# Patient Record
Sex: Male | Born: 1990
Health system: Southern US, Community
[De-identification: ages and names within clinical notes are randomized; demographics above are authoritative.]

## PROBLEM LIST (undated history)

## (undated) DIAGNOSIS — E079 Disorder of thyroid, unspecified: Secondary | ICD-10-CM

## (undated) DIAGNOSIS — T7840XA Allergy, unspecified, initial encounter: Secondary | ICD-10-CM

## (undated) DIAGNOSIS — Z8619 Personal history of other infectious and parasitic diseases: Secondary | ICD-10-CM

## (undated) DIAGNOSIS — R7989 Other specified abnormal findings of blood chemistry: Secondary | ICD-10-CM

## (undated) DIAGNOSIS — R011 Cardiac murmur, unspecified: Secondary | ICD-10-CM

## (undated) HISTORY — DX: Personal history of other infectious and parasitic diseases: Z86.19

## (undated) HISTORY — DX: Cardiac murmur, unspecified: R01.1

## (undated) HISTORY — DX: Disorder of thyroid, unspecified: E07.9

## (undated) HISTORY — DX: Allergy, unspecified, initial encounter: T78.40XA

---
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
## 2010-06-10 ENCOUNTER — Other Ambulatory Visit: Payer: Self-pay | Admitting: Gastroenterology

## 2010-06-16 ENCOUNTER — Ambulatory Visit
Admission: RE | Admit: 2010-06-16 | Discharge: 2010-06-16 | Disposition: A | Discharge status: Home / Self Care | Payer: Federal, State, Local not specified - PPO | Source: Ambulatory Visit | Attending: Gastroenterology | Admitting: Gastroenterology

## 2011-01-25 ENCOUNTER — Ambulatory Visit (INDEPENDENT_AMBULATORY_CARE_PROVIDER_SITE_OTHER): Payer: Federal, State, Local not specified - PPO

## 2011-01-25 DIAGNOSIS — Z23 Encounter for immunization: Secondary | ICD-10-CM

## 2015-04-18 HISTORY — PX: TISSUE GRAFT: SHX251

## 2015-08-12 DIAGNOSIS — T23292S Burn of second degree of multiple sites of left wrist and hand, sequela: Secondary | ICD-10-CM | POA: Diagnosis not present

## 2015-08-12 DIAGNOSIS — Z945 Skin transplant status: Secondary | ICD-10-CM | POA: Diagnosis not present

## 2015-08-12 DIAGNOSIS — L905 Scar conditions and fibrosis of skin: Secondary | ICD-10-CM | POA: Diagnosis not present

## 2015-08-12 DIAGNOSIS — T310 Burns involving less than 10% of body surface: Secondary | ICD-10-CM | POA: Diagnosis not present

## 2015-08-12 DIAGNOSIS — I998 Other disorder of circulatory system: Secondary | ICD-10-CM | POA: Diagnosis not present

## 2015-08-19 DIAGNOSIS — Z6825 Body mass index (BMI) 25.0-25.9, adult: Secondary | ICD-10-CM | POA: Diagnosis not present

## 2015-08-19 DIAGNOSIS — T23291A Burn of second degree of multiple sites of right wrist and hand, initial encounter: Secondary | ICD-10-CM | POA: Diagnosis not present

## 2015-10-20 DIAGNOSIS — T23291D Burn of second degree of multiple sites of right wrist and hand, subsequent encounter: Secondary | ICD-10-CM | POA: Diagnosis not present

## 2015-10-20 DIAGNOSIS — L910 Hypertrophic scar: Secondary | ICD-10-CM | POA: Diagnosis not present

## 2015-10-20 DIAGNOSIS — R202 Paresthesia of skin: Secondary | ICD-10-CM | POA: Diagnosis not present

## 2015-10-20 DIAGNOSIS — T23392A Burn of third degree of multiple sites of left wrist and hand, initial encounter: Secondary | ICD-10-CM | POA: Diagnosis not present

## 2015-10-20 DIAGNOSIS — T23292A Burn of second degree of multiple sites of left wrist and hand, initial encounter: Secondary | ICD-10-CM | POA: Diagnosis not present

## 2015-10-20 DIAGNOSIS — Z6825 Body mass index (BMI) 25.0-25.9, adult: Secondary | ICD-10-CM | POA: Diagnosis not present

## 2015-10-20 DIAGNOSIS — T310 Burns involving less than 10% of body surface: Secondary | ICD-10-CM | POA: Diagnosis not present

## 2015-11-15 ENCOUNTER — Ambulatory Visit (INDEPENDENT_AMBULATORY_CARE_PROVIDER_SITE_OTHER): Payer: Federal, State, Local not specified - PPO | Admitting: Internal Medicine

## 2015-11-15 ENCOUNTER — Encounter: Payer: Self-pay | Admitting: Internal Medicine

## 2015-11-15 VITALS — BP 110/78 | HR 72 | Temp 98.2°F | Ht 66.5 in | Wt 155.8 lb

## 2015-11-15 DIAGNOSIS — E031 Congenital hypothyroidism without goiter: Secondary | ICD-10-CM | POA: Diagnosis not present

## 2015-11-15 DIAGNOSIS — R6889 Other general symptoms and signs: Secondary | ICD-10-CM | POA: Diagnosis not present

## 2015-11-15 DIAGNOSIS — J302 Other seasonal allergic rhinitis: Secondary | ICD-10-CM

## 2015-11-15 DIAGNOSIS — T23302D Burn of third degree of left hand, unspecified site, subsequent encounter: Secondary | ICD-10-CM

## 2015-11-15 DIAGNOSIS — Z0001 Encounter for general adult medical examination with abnormal findings: Secondary | ICD-10-CM

## 2015-11-15 DIAGNOSIS — R0789 Other chest pain: Secondary | ICD-10-CM | POA: Diagnosis not present

## 2015-11-15 DIAGNOSIS — T23302A Burn of third degree of left hand, unspecified site, initial encounter: Secondary | ICD-10-CM | POA: Insufficient documentation

## 2015-11-15 DIAGNOSIS — Z0000 Encounter for general adult medical examination without abnormal findings: Secondary | ICD-10-CM

## 2015-11-15 DIAGNOSIS — K222 Esophageal obstruction: Secondary | ICD-10-CM | POA: Insufficient documentation

## 2015-11-15 LAB — COMPREHENSIVE METABOLIC PANEL
ALT: 15 U/L (ref 0–53)
AST: 16 U/L (ref 0–37)
Albumin: 4.6 g/dL (ref 3.5–5.2)
Alkaline Phosphatase: 84 U/L (ref 39–117)
BILIRUBIN TOTAL: 0.6 mg/dL (ref 0.2–1.2)
BUN: 18 mg/dL (ref 6–23)
CHLORIDE: 103 meq/L (ref 96–112)
CO2: 31 meq/L (ref 19–32)
CREATININE: 0.84 mg/dL (ref 0.40–1.50)
Calcium: 10 mg/dL (ref 8.4–10.5)
GFR: 118.44 mL/min (ref >60.00)
GLUCOSE: 87 mg/dL (ref 70–99)
Potassium: 4.7 mEq/L (ref 3.5–5.1)
Sodium: 139 mEq/L (ref 135–145)
Total Protein: 7.8 g/dL (ref 6.0–8.3)

## 2015-11-15 LAB — CBC
HCT: 45 % (ref 39.0–52.0)
Hemoglobin: 15.3 g/dL (ref 13.0–17.0)
MCHC: 33.9 g/dL (ref 30.0–36.0)
MCV: 85.6 fl (ref 78.0–100.0)
Platelets: 289 10*3/uL (ref 150.0–400.0)
RBC: 5.26 Mil/uL (ref 4.22–5.81)
RDW: 13.2 % (ref 11.5–15.5)
WBC: 5.8 10*3/uL (ref 4.0–10.5)

## 2015-11-15 LAB — LIPID PANEL
CHOL/HDL RATIO: 4
Cholesterol: 186 mg/dL (ref 0–200)
HDL: 49.8 mg/dL (ref >39.00)
LDL CALC: 118 mg/dL — AB (ref 0–99)
NonHDL: 136.6
TRIGLYCERIDES: 95 mg/dL (ref 0.0–149.0)
VLDL: 19 mg/dL (ref 0.0–40.0)

## 2015-11-15 LAB — TSH: TSH: 0.9 u[IU]/mL (ref 0.35–4.50)
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
## 2015-11-15 NOTE — Assessment & Plan Note (Signed)
Continue Hydroxyzine daily

## 2015-11-15 NOTE — Assessment & Plan Note (Signed)
S/p skin graft Continue compression sleeve to left hand

## 2015-11-15 NOTE — Assessment & Plan Note (Signed)
Asymptomatic. -TSH today  

## 2015-11-15 NOTE — Progress Notes (Signed)
Pre visit review using our clinic review tool, if applicable. No additional management support is needed unless otherwise documented below in the visit note.    pt has not had PCP since peds.Marland KitchenMarland KitchenMarland KitchenFlu--01/2015...Marland KitchenTD--2010.Marland KitchenMarland KitchenMening--2007.... [facility]--PRN... Dentist--annually.Marland KitchenMarland Kitchen

## 2015-11-15 NOTE — Patient Instructions (Signed)

## 2015-11-15 NOTE — Assessment & Plan Note (Signed)
No real swallowing issues at this time Will monitor for now

## 2015-12-03 ENCOUNTER — Telehealth: Payer: Self-pay

## 2015-12-03 NOTE — Telephone Encounter (Signed)
Requesting a referral to card.

## 2015-12-04 NOTE — Telephone Encounter (Signed)
I have not seen this patient since he was a child and I have a very closed primary care panel. I have a very hard rule of no new patients, indefinitely.  I am going to send this to Mrs. Baity who just did his physical.

## 2015-12-06 NOTE — Telephone Encounter (Signed)
Why don't you have him come in so we can discuss ways to manage his stress before sending him to cardiology necessarily.

## 2015-12-08 ENCOUNTER — Other Ambulatory Visit: Payer: Self-pay | Admitting: Internal Medicine

## 2015-12-08 DIAGNOSIS — R0789 Other chest pain: Secondary | ICD-10-CM

## 2015-12-08 NOTE — Telephone Encounter (Signed)
Referral placed to cardiology although I still think this is stress/anxiety related.

## 2015-12-08 NOTE — Telephone Encounter (Signed)
Pt states he spoke to his father and due to his family history of heart problems with father and grandfather, they would rather him be seen by cardiology...the patient states that if you must see him before referring to Cards, he will come in for OV

## 2015-12-21 DIAGNOSIS — K080 Exfoliation of teeth due to systemic causes: Secondary | ICD-10-CM | POA: Diagnosis not present

## 2016-01-03 ENCOUNTER — Ambulatory Visit (INDEPENDENT_AMBULATORY_CARE_PROVIDER_SITE_OTHER): Payer: Federal, State, Local not specified - PPO | Admitting: Cardiology

## 2016-01-03 ENCOUNTER — Encounter (INDEPENDENT_AMBULATORY_CARE_PROVIDER_SITE_OTHER): Payer: Self-pay

## 2016-01-03 ENCOUNTER — Encounter: Payer: Self-pay | Admitting: Cardiology

## 2016-01-03 VITALS — BP 114/76 | HR 74 | Ht 67.0 in | Wt 158.2 lb

## 2016-01-03 DIAGNOSIS — R079 Chest pain, unspecified: Secondary | ICD-10-CM

## 2016-01-03 NOTE — Patient Instructions (Signed)
Testing/Procedures: Your physician has requested that you have an echocardiogram. Echocardiography is a painless test that uses sound waves to create images of your heart. It provides your doctor with information about the size and shape of your heart and how well your heart's chambers and valves are working. This procedure takes approximately one hour. There are no restrictions for this procedure.  Your physician has requested that you have an exercise tolerance test. For further information please visit [email or web address] Please also follow instruction sheet, as given.   Avoid all forms of caffeine 24 hours before your test or as directed by your health care provider. This includes coffee, tea (even decaffeinated tea), caffeinated sodas, chocolate, cocoa, and certain pain medicines.  Do not smoke for 4 hours prior to the test or as directed by your health care provider.  Do not apply lotions, powders, creams, or oils on your chest prior to the test.  Wear loose-fitting clothes and comfortable shoes for the test. This test involves walking on a treadmill.  Follow-Up: Your physician recommends that you schedule a follow-up appointment as needed. We will call you with results and if needed schedule follow up at that time.  It was a pleasure seeing you today here in the office. Please do not hesitate to give [place] a call back if you have any further questions. [phone]  Plevna Cellar RN, BSN      Echocardiogram An echocardiogram, or echocardiography, uses sound waves (ultrasound) to produce an image of your heart. The echocardiogram is simple, painless, obtained within a short period of time, and offers valuable information to your health care provider. The images from an echocardiogram can provide information such as:  Evidence of coronary artery disease (CAD).  Heart size.  Heart muscle function.  Heart valve function.  Aneurysm detection.  Evidence of a past heart  attack.  Fluid buildup around the heart.  Heart muscle thickening.  Assess heart valve function. LET [facility] CARE PROVIDER KNOW ABOUT:  Any allergies you have.  All medicines you are taking, including vitamins, herbs, eye drops, creams, and over-the-counter medicines.  Previous problems you or members of your family have had with the use of anesthetics.  Any blood disorders you have.  Previous surgeries you have had.  Medical conditions you have.  Possibility of pregnancy, if this applies. BEFORE THE PROCEDURE  No special preparation is needed. Eat and drink normally.  PROCEDURE   In order to produce an image of your heart, gel will be applied to your chest and a wand-like tool (transducer) will be moved over your chest. The gel will help transmit the sound waves from the transducer. The sound waves will harmlessly bounce off your heart to allow the heart images to be captured in real-time motion. These images will then be recorded.  You may need an IV to receive a medicine that improves the quality of the pictures. AFTER THE PROCEDURE You may return to your normal schedule including diet, activities, and medicines, unless your health care provider tells you otherwise.   This information is not intended to replace advice given to you by your health care provider. Make sure you discuss any questions you have with your health care provider.   Document Released: 03/31/2000 Document Revised: 04/24/2014 Document Reviewed: 12/09/2012 Elsevier Interactive Patient Education 2016 [employer].      Exercise Stress Electrocardiogram An exercise stress electrocardiogram is a test that is done to evaluate the blood supply to your heart. This test may also be  called exercise stress electrocardiography. The test is done while you are walking on a treadmill. The goal of this test is to raise your heart rate. This test is done to find areas of poor blood flow to the heart by  determining the extent of coronary artery disease (CAD).   CAD is defined as narrowing in one or more heart (coronary) arteries of more than 70%. If you have an abnormal test result, this may mean that you are not getting adequate blood flow to your heart during exercise. Additional testing may be needed to understand why your test was abnormal. [facility] KNOW ABOUT:   Any allergies you have.  All medicines you are taking, including vitamins, herbs, eye drops, creams, and over-the-counter medicines.  Previous problems you or members of your family have had with the use of anesthetics.  Any blood disorders you have.  Previous surgeries you have had.  Medical conditions you have.  Possibility of pregnancy, if this applies. RISKS AND COMPLICATIONS Generally, this is a safe procedure. However, as with any procedure, complications can occur. Possible complications can include:  Pain or pressure in the following areas:  Chest.  Jaw or neck.  Between your shoulder blades.  Radiating down your left arm.  Dizziness or light-headedness.  Shortness of breath.  Increased or irregular heartbeats.  Nausea or vomiting.  Heart attack (rare). BEFORE THE PROCEDURE  Avoid all forms of caffeine 24 hours before your test or as directed by your health care provider. This includes coffee, tea (even decaffeinated tea), caffeinated sodas, chocolate, cocoa, and certain pain medicines.  Follow your health care provider's instructions regarding eating and drinking before the test.  Take your medicines as directed at regular times with water unless instructed otherwise. Exceptions may include:  If you have diabetes, ask how you are to take your insulin or pills. It is common to adjust insulin dosing the morning of the test.  If you are taking beta-blocker medicines, it is important to talk to your health care provider about these medicines well before the date of your test.  Taking beta-blocker medicines may interfere with the test. In some cases, these medicines need to be changed or stopped 24 hours or more before the test.  If you wear a nitroglycerin patch, it may need to be removed prior to the test. Ask your health care provider if the patch should be removed before the test.  If you use an inhaler for any breathing condition, bring it with you to the test.  If you are an outpatient, bring a snack so you can eat right after the stress phase of the test.  Do not smoke for 4 hours prior to the test or as directed by your health care provider.  Do not apply lotions, powders, creams, or oils on your chest prior to the test.  Wear loose-fitting clothes and comfortable shoes for the test. This test involves walking on a treadmill. PROCEDURE  Multiple patches (electrodes) will be put on your chest. If needed, small areas of your chest may have to be shaved to get better contact with the electrodes. Once the electrodes are attached to your body, multiple wires will be attached to the electrodes and your heart rate will be monitored.  Your heart will be monitored both at rest and while exercising.  You will walk on a treadmill. The treadmill will be started at a slow pace. The treadmill speed and incline will gradually be increased to raise  your heart rate. AFTER THE PROCEDURE  Your heart rate and blood pressure will be monitored after the test.  You may return to your normal schedule including diet, activities, and medicines, unless your health care provider tells you otherwise.   This information is not intended to replace advice given to you by your health care provider. Make sure you discuss any questions you have with your health care provider.   Document Released: 03/31/2000 Document Revised: 04/08/2013 Document Reviewed: 12/09/2012 Elsevier Interactive Patient Education [employer]

## 2016-01-03 NOTE — Progress Notes (Signed)
Cardiology Office Note   Date:  01/03/2016   ID:  Jack Hicks, DOB 1990/05/18, MRN [id number]  Referring Doctor:  Nicki Reaper, NP   Cardiologist:   Almond Lint, MD   Reason for consultation:  Chief Complaint  Patient presents with  . other    Chest tightness . Meds reviewed verbally with pt.      History of Present Illness: Jack Hicks is a 25 y.o. male who presents for  Chest pain. This has Been going on on and off for 6 months now. He describes the chest pain as tightness in the center of the chest, radiating down to the left arm. This is mostly brought on with increased stress from work. He runs a Medical sales representative. Intensity is mild to moderate, lasting minutes at a time, resolving spontaneously. Because of family history of CAD, he was concerned about her symptoms and wanted it to be checked out. He is also training for a half marathon and wanted to make sure that this chest pain is not related to his heart.   He has shortness of breath during training for the half marathon, running outside. He feels that the shortness of breath is in proportion to the level of activity he is doing. No PND, orthopnea, edema. No palpitations, loss of consciousness.  ROS:  Please see the history of present illness. Aside from mentioned under HPI, all other systems are reviewed and negative.     Past Medical History:  Diagnosis Date  . Allergy   . Heart murmur    as child  . History of chicken pox   . Thyroid disease    borderline Hypothyroidism     Past Surgical History:  Procedure Laterality Date  . TISSUE GRAFT Left 2017     reports that he has never smoked. He has never used smokeless tobacco. He reports that he drinks about 0.6 oz of alcohol per week . He reports that he does not use drugs.   family history includes Arthritis in his paternal grandfather; Hyperlipidemia in his father, maternal grandfather, maternal grandmother, mother, paternal grandfather, and paternal  grandmother; Hypertension in his father, maternal grandfather, maternal grandmother, mother, paternal grandfather, and paternal grandmother; Prostate cancer in his maternal grandfather; Stroke in his maternal grandfather.   Outpatient Medications Prior to Visit  Medication Sig Dispense Refill  . hydrOXYzine (ATARAX) 10 MG/5ML syrup Take 12.5 mLs by mouth 3 (three) times daily.  2   No facility-administered medications prior to visit.      Allergies: Review of patient's allergies indicates not on file.    PHYSICAL EXAM: VS:  BP 114/76 (BP Location: Right Arm, Patient Position: Sitting, Cuff Size: Normal)   Pulse 74   Ht 5\' 7"  (1.702 m)   Wt 158 lb 4 oz (71.8 kg)   BMI 24.79 kg/m  , Body mass index is 24.79 kg/m. Wt Readings from Last 3 Encounters:  01/03/16 158 lb 4 oz (71.8 kg)  11/15/15 155 lb 12 oz (70.6 kg)  09/10/08 126 lb 12.8 oz (57.5 kg) (17 %, Z= -0.97)*   * Growth percentiles are based on CDC 2-20 Years data.    GENERAL:  well developed, well nourished, not in acute distress HEENT: normocephalic, pink conjunctivae, anicteric sclerae, no xanthelasma, normal dentition, oropharynx clear NECK:  no neck vein engorgement, JVP normal, no hepatojugular reflux, carotid upstroke brisk and symmetric, no bruit, no thyromegaly, no lymphadenopathy LUNGS:  good respiratory effort, clear to auscultation bilaterally CV:  PMI not displaced, no thrills, no lifts, S1 and S2 within normal limits, no palpable S3 or S4, no murmurs, no rubs, no gallops ABD:  Soft, nontender, nondistended, normoactive bowel sounds, no abdominal aortic bruit, no hepatomegaly, no splenomegaly MS: nontender back, no kyphosis, no scoliosis, no joint deformities EXT:  2+ DP/PT pulses, no edema, no varicosities, no cyanosis, no clubbing SKIN: warm, nondiaphoretic, normal turgor, no ulcers NEUROPSYCH: alert, oriented to person, place, and time, sensory/motor grossly intact, normal mood, appropriate affect  Recent  Labs: 11/15/2015: ALT 15; BUN 18; Creatinine, Ser 0.84; Hemoglobin 15.3; Platelets 289.0; Potassium 4.7; Sodium 139; TSH 0.90   Lipid Panel    Component Value Date/Time   CHOL 186 11/15/2015 1033   TRIG 95.0 11/15/2015 1033   HDL 49.80 11/15/2015 1033   CHOLHDL 4 11/15/2015 1033   VLDL 19.0 11/15/2015 1033   LDLCALC 118 (H) 11/15/2015 1033     Other studies Reviewed:  EKG:  The ekg from 01/03/2016 was personally reviewed by me and it revealed sinus rhythm 74 BPM  Additional studies/ records that were reviewed personally reviewed by me today include: none available   ASSESSMENT AND PLAN:   Chest pain with left arm pain Family history of CAD Recommend to rule out chest pain being not related with treadmill stress test. Recommend echocardiogram as well.  Current medicines are reviewed at length with the patient today.  The patient does not have concerns regarding medicines.  Labs/ tests ordered today include:  Orders Placed This Encounter  Procedures  . Exercise Tolerance Test  . EKG 12-Lead  . ECHOCARDIOGRAM COMPLETE    I had a lengthy and detailed discussion with the patient regarding diagnoses, prognosis, diagnostic options, treatment options.   I counseled the patient on importance of lifestyle modification including heart healthy diet, regular physical activity  Once cardiac workup is completed   Disposition:   FU with undersigned after tests prn   Signed, Almond LintAileen Juniper Cobey, MD  01/03/2016 1:17 PM    [facility]  This note was generated in part with voice recognition software and I apologize for any typographical errors that were not detected and corrected.

## 2016-01-10 DIAGNOSIS — Z6824 Body mass index (BMI) 24.0-24.9, adult: Secondary | ICD-10-CM | POA: Diagnosis not present

## 2016-01-10 DIAGNOSIS — T23291D Burn of second degree of multiple sites of right wrist and hand, subsequent encounter: Secondary | ICD-10-CM | POA: Diagnosis not present

## 2016-01-25 ENCOUNTER — Ambulatory Visit (INDEPENDENT_AMBULATORY_CARE_PROVIDER_SITE_OTHER): Payer: Federal, State, Local not specified - PPO

## 2016-01-25 ENCOUNTER — Other Ambulatory Visit: Payer: Self-pay

## 2016-01-25 DIAGNOSIS — R079 Chest pain, unspecified: Secondary | ICD-10-CM

## 2016-01-26 LAB — EXERCISE TOLERANCE TEST
CHL CUP RESTING HR STRESS: 86 {beats}/min
CSEPEDS: 37 s
CSEPHR: 86 %
CSEPPHR: 169 {beats}/min
Estimated workload: 9.4 METS
Exercise duration (min): 7 min
MPHR: 195 {beats}/min

## 2016-02-15 DIAGNOSIS — R208 Other disturbances of skin sensation: Secondary | ICD-10-CM | POA: Insufficient documentation

## 2016-02-15 DIAGNOSIS — T23002S Burn of unspecified degree of left hand, unspecified site, sequela: Secondary | ICD-10-CM | POA: Diagnosis not present

## 2016-02-15 DIAGNOSIS — L905 Scar conditions and fibrosis of skin: Secondary | ICD-10-CM | POA: Diagnosis not present

## 2016-02-15 DIAGNOSIS — Z87828 Personal history of other (healed) physical injury and trauma: Secondary | ICD-10-CM | POA: Diagnosis not present

## 2016-02-15 DIAGNOSIS — L910 Hypertrophic scar: Secondary | ICD-10-CM | POA: Diagnosis not present

## 2016-03-28 DIAGNOSIS — T23002S Burn of unspecified degree of left hand, unspecified site, sequela: Secondary | ICD-10-CM | POA: Diagnosis not present

## 2016-03-28 DIAGNOSIS — R208 Other disturbances of skin sensation: Secondary | ICD-10-CM | POA: Diagnosis not present

## 2016-03-28 DIAGNOSIS — L910 Hypertrophic scar: Secondary | ICD-10-CM | POA: Diagnosis not present

## 2016-03-28 DIAGNOSIS — L905 Scar conditions and fibrosis of skin: Secondary | ICD-10-CM | POA: Diagnosis not present

## 2016-03-28 DIAGNOSIS — Z87828 Personal history of other (healed) physical injury and trauma: Secondary | ICD-10-CM | POA: Diagnosis not present

## 2016-05-16 DIAGNOSIS — T23002S Burn of unspecified degree of left hand, unspecified site, sequela: Secondary | ICD-10-CM | POA: Diagnosis not present

## 2016-05-16 DIAGNOSIS — L299 Pruritus, unspecified: Secondary | ICD-10-CM | POA: Diagnosis not present

## 2016-05-16 DIAGNOSIS — L910 Hypertrophic scar: Secondary | ICD-10-CM | POA: Diagnosis not present

## 2016-05-16 DIAGNOSIS — R208 Other disturbances of skin sensation: Secondary | ICD-10-CM | POA: Diagnosis not present

## 2016-05-16 DIAGNOSIS — L905 Scar conditions and fibrosis of skin: Secondary | ICD-10-CM | POA: Diagnosis not present

## 2016-05-16 DIAGNOSIS — Z87828 Personal history of other (healed) physical injury and trauma: Secondary | ICD-10-CM | POA: Diagnosis not present

## 2016-07-04 DIAGNOSIS — T23002S Burn of unspecified degree of left hand, unspecified site, sequela: Secondary | ICD-10-CM | POA: Diagnosis not present

## 2016-07-04 DIAGNOSIS — Z87828 Personal history of other (healed) physical injury and trauma: Secondary | ICD-10-CM | POA: Diagnosis not present

## 2016-07-04 DIAGNOSIS — L299 Pruritus, unspecified: Secondary | ICD-10-CM | POA: Diagnosis not present

## 2016-07-04 DIAGNOSIS — L910 Hypertrophic scar: Secondary | ICD-10-CM | POA: Diagnosis not present

## 2016-07-04 DIAGNOSIS — L905 Scar conditions and fibrosis of skin: Secondary | ICD-10-CM | POA: Diagnosis not present

## 2016-07-31 ENCOUNTER — Telehealth: Payer: Self-pay | Admitting: Cardiology

## 2016-07-31 NOTE — Telephone Encounter (Signed)
Received records request from [employer], forwarded to [facility] for processing.

## 2016-08-21 ENCOUNTER — Telehealth: Payer: Self-pay | Admitting: Cardiology

## 2016-08-21 NOTE — Telephone Encounter (Signed)
Received records request from [facility], forwarded to [facility] for processing. ° °

## 2016-08-29 DIAGNOSIS — L905 Scar conditions and fibrosis of skin: Secondary | ICD-10-CM | POA: Diagnosis not present

## 2016-08-29 DIAGNOSIS — T23002S Burn of unspecified degree of left hand, unspecified site, sequela: Secondary | ICD-10-CM | POA: Diagnosis not present

## 2016-08-29 DIAGNOSIS — L299 Pruritus, unspecified: Secondary | ICD-10-CM | POA: Diagnosis not present

## 2016-08-29 DIAGNOSIS — L910 Hypertrophic scar: Secondary | ICD-10-CM | POA: Diagnosis not present

## 2016-09-20 DIAGNOSIS — Z6825 Body mass index (BMI) 25.0-25.9, adult: Secondary | ICD-10-CM | POA: Diagnosis not present

## 2016-09-20 DIAGNOSIS — L910 Hypertrophic scar: Secondary | ICD-10-CM | POA: Insufficient documentation

## 2016-09-20 DIAGNOSIS — L905 Scar conditions and fibrosis of skin: Secondary | ICD-10-CM | POA: Insufficient documentation

## 2016-09-20 DIAGNOSIS — Z87828 Personal history of other (healed) physical injury and trauma: Secondary | ICD-10-CM | POA: Diagnosis not present

## 2016-10-30 DIAGNOSIS — L910 Hypertrophic scar: Secondary | ICD-10-CM | POA: Diagnosis not present

## 2016-10-30 DIAGNOSIS — L905 Scar conditions and fibrosis of skin: Secondary | ICD-10-CM | POA: Diagnosis not present

## 2016-10-30 DIAGNOSIS — T23002S Burn of unspecified degree of left hand, unspecified site, sequela: Secondary | ICD-10-CM | POA: Diagnosis not present

## 2016-10-30 DIAGNOSIS — T23242S Burn of second degree of multiple left fingers (nail), including thumb, sequela: Secondary | ICD-10-CM | POA: Diagnosis not present

## 2016-10-30 DIAGNOSIS — T310 Burns involving less than 10% of body surface: Secondary | ICD-10-CM | POA: Diagnosis not present

## 2016-10-30 DIAGNOSIS — T23262S Burn of second degree of back of left hand, sequela: Secondary | ICD-10-CM | POA: Diagnosis not present

## 2016-12-21 ENCOUNTER — Ambulatory Visit (INDEPENDENT_AMBULATORY_CARE_PROVIDER_SITE_OTHER): Payer: Federal, State, Local not specified - PPO | Admitting: Internal Medicine

## 2016-12-21 ENCOUNTER — Encounter: Payer: Self-pay | Admitting: Internal Medicine

## 2016-12-21 ENCOUNTER — Ambulatory Visit (INDEPENDENT_AMBULATORY_CARE_PROVIDER_SITE_OTHER)
Admission: RE | Admit: 2016-12-21 | Discharge: 2016-12-21 | Disposition: A | Discharge status: Home / Self Care | Payer: Federal, State, Local not specified - PPO | Source: Ambulatory Visit | Attending: Internal Medicine | Admitting: Internal Medicine

## 2016-12-21 VITALS — BP 110/78 | HR 74 | Temp 97.9°F | Ht 66.5 in | Wt 158.5 lb

## 2016-12-21 DIAGNOSIS — M25562 Pain in left knee: Secondary | ICD-10-CM

## 2016-12-21 DIAGNOSIS — Z0001 Encounter for general adult medical examination with abnormal findings: Secondary | ICD-10-CM | POA: Diagnosis not present

## 2016-12-21 DIAGNOSIS — Z114 Encounter for screening for human immunodeficiency virus [HIV]: Secondary | ICD-10-CM

## 2016-12-21 LAB — LIPID PANEL
CHOL/HDL RATIO: 4
CHOLESTEROL: 207 mg/dL — AB (ref 0–200)
HDL: 48.5 mg/dL (ref >39.00)
LDL Cholesterol: 142 mg/dL — ABNORMAL HIGH (ref 0–99)
NonHDL: 158.76
TRIGLYCERIDES: 82 mg/dL (ref 0.0–149.0)
VLDL: 16.4 mg/dL (ref 0.0–40.0)

## 2016-12-21 LAB — COMPREHENSIVE METABOLIC PANEL
ALBUMIN: 4.8 g/dL (ref 3.5–5.2)
ALK PHOS: 75 U/L (ref 39–117)
ALT: 19 U/L (ref 0–53)
AST: 17 U/L (ref 0–37)
BILIRUBIN TOTAL: 0.5 mg/dL (ref 0.2–1.2)
BUN: 20 mg/dL (ref 6–23)
CALCIUM: 10 mg/dL (ref 8.4–10.5)
CO2: 29 mEq/L (ref 19–32)
Chloride: 103 mEq/L (ref 96–112)
Creatinine, Ser: 0.79 mg/dL (ref 0.40–1.50)
GFR: 126.02 mL/min (ref >60.00)
GLUCOSE: 91 mg/dL (ref 70–99)
Potassium: 4.7 mEq/L (ref 3.5–5.1)
Sodium: 140 mEq/L (ref 135–145)
TOTAL PROTEIN: 7.6 g/dL (ref 6.0–8.3)

## 2016-12-21 LAB — TSH: TSH: 0.92 u[IU]/mL (ref 0.35–4.50)

## 2016-12-21 LAB — CBC
HCT: 45.3 % (ref 39.0–52.0)
HEMOGLOBIN: 15.1 g/dL (ref 13.0–17.0)
MCHC: 33.3 g/dL (ref 30.0–36.0)
MCV: 89.2 fl (ref 78.0–100.0)
PLATELETS: 303 10*3/uL (ref 150.0–400.0)
RBC: 5.08 Mil/uL (ref 4.22–5.81)
RDW: 12.7 % (ref 11.5–15.5)
WBC: 5.5 10*3/uL (ref 4.0–10.5)

## 2016-12-21 NOTE — Patient Instructions (Signed)
Preventive Care for [place], Male The transition to life after high school as a young adult can be a stressful time with many changes. You may start seeing a primary care physician instead of a pediatrician. This is the time when your health care becomes your responsibility. Preventive care refers to lifestyle choices and visits with your health care provider that can promote health and wellness. What does preventive care include?  A yearly physical exam. This is also called an annual wellness visit.  Dental exams once or twice a year.  Routine eye exams. Ask your health care provider how often you should have your eyes checked.  Personal lifestyle choices, including: ? Daily care of your teeth and gums. ? Regular physical activity. ? Eating a healthy diet. ? Avoiding tobacco and drug use. ? Avoiding or limiting alcohol use. ? Practicing safe sex. What happens during an annual wellness visit? Preventive care starts with a yearly visit to your primary care physician. The services and screenings done by your health care provider during your annual wellness visit will depend on your overall health, lifestyle risk factors, and family history of disease. Counseling Your health care provider may ask you questions about:  Past medical problems and your family's medical history.  Medicines or supplements that you take.  Health insurance and access to health care.  Alcohol, tobacco, and drug use, including use of any bodybuilding drugs (anabolic steroids).  Your safety at home, work, or school.  Access to firearms.  Emotional well-being and how you cope with stress.  Relationship well-being.  Diet, exercise, and sleep habits.  Your sexual health and activity.  Screening You may have the following tests or measurements:  Height, weight, and BMI.  Blood pressure.  Lipid and cholesterol levels.  Tuberculosis skin test.  Skin exam.  Vision and hearing tests.  Genital  exam to check for testicular cancer or hernias.  Screening test for hepatitis.  Screening tests for STDs (sexually transmitted diseases), if you are at risk.  Vaccines Your health care provider may recommend certain vaccines, such as:  Influenza vaccine. This is recommended every year.  Tetanus, diphtheria, and acellular pertussis (Tdap, Td) vaccine. You may need a Td booster every 10 years.  Varicella vaccine. You may need this if you have not been vaccinated.  HPV vaccine. If you are 8 or younger, you may need three doses over 6 months.  Measles, mumps, and rubella (MMR) vaccine. You may need at least one dose of MMR. You may also need a second dose.  Pneumococcal 13-valent conjugate (PCV13) vaccine. You may need this if you have certain conditions and have not been vaccinated.  Pneumococcal polysaccharide (PPSV23) vaccine. You may need one or two doses if you smoke cigarettes or if you have certain conditions.  Meningococcal vaccine. One dose is recommended if you are age 83-21 years and a first-year college student living in a residence hall, or if you have one of several medical conditions. You may also need additional booster doses.  Hepatitis A vaccine. You may need this if you have certain conditions or if you travel or work in places where you may be exposed to hepatitis A.  Hepatitis B vaccine. You may need this if you have certain conditions or if you travel or work in places where you may be exposed to hepatitis B.  Haemophilus influenzae type b (Hib) vaccine. You may need this if you have certain risk factors.  Talk to your health care provider about which  screenings and vaccines you need and how often you need them. What steps can I take to develop healthy behaviors?  Have regular preventive health care visits with your primary care physician and dentist.  Eat a healthy diet.  Drink enough fluid to keep your urine clear or pale yellow.  Stay active. Exercise at  least 30 minutes 5 or more days of the week.  Use alcohol responsibly.  Maintain a healthy weight.  Do not use any products that contain nicotine, such as cigarettes, chewing tobacco, and e-cigarettes. If you need help quitting, ask your health care provider.  Do not use drugs.  Practice safe sex. This includes using condoms to prevent STDs or an unwanted pregnancy.  Find healthy ways to manage stress. How can I protect myself from injury? Injuries from violence or accidents are the leading cause of death among young adults and can often be prevented. Take these steps to help protect yourself:  Always wear your seat belt while driving or riding in a vehicle.  Do not drive if you have been drinking alcohol. Do not ride with someone who has been drinking.  Do not drive when you are tired or distracted. Do not text while driving.  Wear a helmet and other protective equipment during sports activities.  If you have firearms in your house, make sure you follow all gun safety procedures.  Seek help if you have been bullied, physically abused, or sexually abused.  Avoid fighting.  Use the Internet responsibly to avoid dangers such as online bullying. What can I do to cope with stress? Young adults may face many new challenges that can be stressful, such as finding a job, going to college, moving away from home, managing money, being in a relationship, getting married, and having children. To manage stress:  Avoid known stressful situations when you can.  Exercise regularly.  Find a stress-reducing activity that works best for you. Examples include meditation, yoga, listening to music, or reading.  Spend time in nature.  Keep a journal to write about your stress and how you respond.  Talk to your health care provider about stress. He or she may suggest counseling.  Spend time with supportive friends or family.  Do not cope with stress by:  Drinking alcohol or using  drugs.  Smoking cigarettes.  Eating. Where can I get more information? Learn more about preventive care and healthy habits from:  U.S. Preventive Services Task Force: www.uspreventiveservicestaskforce.org/Tools/ConsumerInfo/Index/information-for-consumers  National Adolescent and Young Adult Health Information Center: http://nahic.ucsf.edu/resource-center/  American Academy of Pediatrics Bright Futures: https://brightfutures.aap.org  Society for Adolescent Health and Medicine: www.adolescenthealth.org/Resources/Clinical-Care-Resources/Mental-Health/Mental-Health-Resources-For-Adolesc.aspx  HealthCare.gov: www.healthcare.gov/young-adults/coverage/ This information is not intended to replace advice given to you by your health care provider. Make sure you discuss any questions you have with your health care provider. Document Released: 08/19/2015 Document Revised: 09/09/2015 Document Reviewed: 08/19/2015 Elsevier Interactive Patient Education  2017 Elsevier Inc.  

## 2016-12-21 NOTE — Addendum Note (Signed)
Addended by: Alvina ChouWALSH, Guelda Batson J on: 12/21/2016 09:25 AM   Modules accepted: Orders

## 2016-12-21 NOTE — Progress Notes (Signed)
Subjective:    Patient ID: Juliette MangleBrandon A Stout, male    DOB: 03/17/1991, 26 y.o.   MRN: [id number]  HPI  Pt presents to the clinic today for his annual exam. He is concerned about left knee pain. He reports this started in October, while he was running a marathon. He describes the pain as sharp and stabbing. He reports he gets to 3/4 mile and it really starts to bother him. He has not noticed any swelling or weakness, just pain. He has tried resting, but reports when he runs, it starts to bother him. He has not taken anything OTC for this.  Flu: 01/2016 Tetanus: 05/2015 Dentist: annually  Diet: He does eat meat. He consumes fruits and veggies daily. He occasionally eats fried foods. He drinks mostly water. Exercise: He has been training for a marathon.  Review of Systems      Past Medical History:  Diagnosis Date  . Allergy   . Heart murmur    as child  . History of chicken pox   . Thyroid disease    borderline Hypothyroidism     Current Outpatient Prescriptions  Medication Sig Dispense Refill  . hydrOXYzine (ATARAX) 10 MG/5ML syrup Take 12.5 mLs by mouth 3 (three) times daily.  2   No current facility-administered medications for this visit.     Not on File  Family History  Problem Relation Age of Onset  . Hyperlipidemia Mother   . Hypertension Mother   . Hyperlipidemia Father   . Hypertension Father   . Hyperlipidemia Maternal Grandmother   . Hypertension Maternal Grandmother   . Prostate cancer Maternal Grandfather   . Hyperlipidemia Maternal Grandfather   . Stroke Maternal Grandfather   . Hypertension Maternal Grandfather   . Hyperlipidemia Paternal Grandmother   . Hypertension Paternal Grandmother   . Arthritis Paternal Grandfather   . Hyperlipidemia Paternal Grandfather   . Hypertension Paternal Grandfather     Social History   Social History  . Marital status: Single    Spouse name: N/A  . Number of children: N/A  . Years of education: N/A    Occupational History  . Not on file.   Social History Main Topics  . Smoking status: Never Smoker  . Smokeless tobacco: Never Used  . Alcohol use 0.6 oz/week    1 Standard drinks or equivalent per week     Comment: occasional  . Drug use: No  . Sexual activity: Yes   Other Topics Concern  . Not on file   Social History Narrative  . No narrative on file     Constitutional: Denies fever, malaise, fatigue, headache or abrupt weight changes.  HEENT: Denies eye pain, eye redness, ear pain, ringing in the ears, wax buildup, runny nose, nasal congestion, bloody nose, or sore throat. Respiratory: Denies difficulty breathing, shortness of breath, cough or sputum production.   Cardiovascular: Denies chest pain, chest tightness, palpitations or swelling in the hands or feet.  Gastrointestinal: Denies abdominal pain, bloating, constipation, diarrhea or blood in the stool.  GU: Denies urgency, frequency, pain with urination, burning sensation, blood in urine, odor or discharge. Musculoskeletal: Pt reports left knee pain. Denies decrease in range of motion, difficulty with gait, muscle pain or joint swelling.  Skin: Denies redness, rashes, lesions or ulcercations.  Neurological: Denies dizziness, difficulty with memory, difficulty with speech or problems with balance and coordination.  Psych: Denies anxiety, depression, SI/HI.  No other specific complaints in a complete review of systems (except as  listed in HPI above).  Objective:   Physical Exam   BP 110/78   Pulse 74   Temp 97.9 F (36.6 C) (Oral)   Ht 5' 6.5" (1.689 m)   Wt 158 lb 8 oz (71.9 kg)   SpO2 98%   BMI 25.20 kg/m  Wt Readings from Last 3 Encounters:  12/21/16 158 lb 8 oz (71.9 kg)  01/03/16 158 lb 4 oz (71.8 kg)  11/15/15 155 lb 12 oz (70.6 kg)    General: Appears his stated age, well developed, well nourished in NAD. Skin: Warm, dry and intact. Scarring noted of left hand. HEENT: Head: normal shape and size;  Eyes: sclera white, no icterus, conjunctiva pink, PERRLA and EOMs intact; Ears: Tm's gray and intact, normal light reflex; Throat/Mouth: Teeth present, mucosa pink and moist, no exudate, lesions or ulcerations noted.  Neck:  Neck supple, trachea midline. No masses, lumps or thyromegaly present.  Cardiovascular: Normal rate and rhythm. S1,S2 noted.  No murmur, rubs or gallops noted. No JVD or BLE edema.  Pulmonary/Chest: Normal effort and positive vesicular breath sounds. No respiratory distress. No wheezes, rales or ronchi noted.  Abdomen: Soft and nontender. Normal bowel sounds. No distention or masses noted. Liver, spleen and kidneys non palpable. Musculoskeletal: Normal flexion and extension of the left knee. No signs of joint swelling. Pain with palpation over the left distal IT band. No difficulty with gait.  Neurological: Alert and oriented. Cranial nerves II-XII grossly intact. Coordination normal.  Psychiatric: Mood and affect normal. Behavior is normal. Judgment and thought content normal.     BMET    Component Value Date/Time   NA 139 11/15/2015 1033   K 4.7 11/15/2015 1033   CL 103 11/15/2015 1033   CO2 31 11/15/2015 1033   GLUCOSE 87 11/15/2015 1033   BUN 18 11/15/2015 1033   CREATININE 0.84 11/15/2015 1033   CALCIUM 10.0 11/15/2015 1033    Lipid Panel     Component Value Date/Time   CHOL 186 11/15/2015 1033   TRIG 95.0 11/15/2015 1033   HDL 49.80 11/15/2015 1033   CHOLHDL 4 11/15/2015 1033   VLDL 19.0 11/15/2015 1033   LDLCALC 118 (H) 11/15/2015 1033    CBC    Component Value Date/Time   WBC 5.8 11/15/2015 1033   RBC 5.26 11/15/2015 1033   HGB 15.3 11/15/2015 1033   HCT 45.0 11/15/2015 1033   PLT 289.0 11/15/2015 1033   MCV 85.6 11/15/2015 1033   MCHC 33.9 11/15/2015 1033   RDW 13.2 11/15/2015 1033    Hgb A1C No results found for: HGBA1C         Assessment & Plan:   Preventative Health Maintenance:  Encouraged him to get a flu shot in the  fall Tetanus UTD Encouraged him to consume a balanced diet and exercise regimen Advised him to see an eye doctor and dentist annually Will check CBC, CMET, Lipid, TSH and HIV today  Left Knee Pain:  Will obtain xray of left knee today Consider wearing a brace for extra support while running. Ice and Ibuprofen may be helpful Consider PT if not improving  RTC in 1 year, sooner if needed Nicki Reaper, NP

## 2016-12-22 LAB — HIV ANTIBODY (ROUTINE TESTING W REFLEX): HIV: NONREACTIVE

## 2017-03-03 DIAGNOSIS — Z23 Encounter for immunization: Secondary | ICD-10-CM | POA: Diagnosis not present

## 2017-07-18 ENCOUNTER — Ambulatory Visit: Payer: BLUE CROSS/BLUE SHIELD | Admitting: Internal Medicine

## 2017-07-18 ENCOUNTER — Encounter: Payer: Self-pay | Admitting: Internal Medicine

## 2017-07-18 VITALS — BP 112/74 | HR 83 | Temp 98.0°F | Ht 66.5 in | Wt 155.0 lb

## 2017-07-18 DIAGNOSIS — F419 Anxiety disorder, unspecified: Secondary | ICD-10-CM

## 2017-07-18 NOTE — Patient Instructions (Signed)

## 2017-07-18 NOTE — Progress Notes (Signed)
Subjective:    Patient ID: Jack Hicks, male    DOB: 03/28/1991, 27 y.o.   MRN: [id number]  HPI  Pt presents to the clinic today to discuss anxiety. He reports over the last few months, he has been under a lot of stress. This is triggered by general life stress. He denies panic attacks. He takes Hydroxyzine prn for chronic itching from a burn on his left hand. He knows this can be used for anxiety and wanted to know if this is something that can be taken on a regular basis or if there is an alternative. He denies any issues with depression. He denies SI/HI.  Review of Systems      Past Medical History:  Diagnosis Date  . Allergy   . Heart murmur    as child  . History of chicken pox   . Thyroid disease    borderline Hypothyroidism     Current Outpatient Medications  Medication Sig Dispense Refill  . hydrOXYzine (ATARAX) 10 MG/5ML syrup Take 12.5 mLs by mouth 3 (three) times daily.  2   No current facility-administered medications for this visit.     No Known Allergies  Family History  Problem Relation Age of Onset  . Hyperlipidemia Mother   . Hypertension Mother   . Hyperlipidemia Father   . Hypertension Father   . Hyperlipidemia Maternal Grandmother   . Hypertension Maternal Grandmother   . Prostate cancer Maternal Grandfather   . Hyperlipidemia Maternal Grandfather   . Stroke Maternal Grandfather   . Hypertension Maternal Grandfather   . Hyperlipidemia Paternal Grandmother   . Hypertension Paternal Grandmother   . Arthritis Paternal Grandfather   . Hyperlipidemia Paternal Grandfather   . Hypertension Paternal Grandfather     Social History   Socioeconomic History  . Marital status: Single    Spouse name: Not on file  . Number of children: Not on file  . Years of education: Not on file  . Highest education level: Not on file  Occupational History  . Not on file  Social Needs  . Financial resource strain: Not on file  . Food insecurity:    Worry:  Not on file    Inability: Not on file  . Transportation needs:    Medical: Not on file    Non-medical: Not on file  Tobacco Use  . Smoking status: Never Smoker  . Smokeless tobacco: Never Used  Substance and Sexual Activity  . Alcohol use: Yes    Alcohol/week: 0.6 oz    Types: 1 Standard drinks or equivalent per week    Comment: occasional  . Drug use: No  . Sexual activity: Yes  Lifestyle  . Physical activity:    Days per week: Not on file    Minutes per session: Not on file  . Stress: Not on file  Relationships  . Social connections:    Talks on phone: Not on file    Gets together: Not on file    Attends religious service: Not on file    Active member of club or organization: Not on file    Attends meetings of clubs or organizations: Not on file    Relationship status: Not on file  . Intimate partner violence:    Fear of current or ex partner: Not on file    Emotionally abused: Not on file    Physically abused: Not on file    Forced sexual activity: Not on file  Other Topics Concern  .  Not on file  Social History Narrative  . Not on file     Constitutional: Denies fever, malaise, fatigue, headache or abrupt weight changes.  Respiratory: Denies difficulty breathing, shortness of breath, cough or sputum production.   Cardiovascular: Denies chest pain, chest tightness, palpitations or swelling in the hands or feet.  Neurological: Denies dizziness, difficulty with memory, difficulty with speech or problems with balance and coordination.  Psych: Pt reports stress and anxiety. Denies SI/HI.  No other specific complaints in a complete review of systems (except as listed in HPI above).  Objective:   Physical Exam   BP 112/74   Pulse 83   Temp 98 F (36.7 C) (Oral)   Ht 5' 6.5" (1.689 m)   Wt 155 lb (70.3 kg)   SpO2 98%   BMI 24.64 kg/m  Wt Readings from Last 3 Encounters:  07/18/17 155 lb (70.3 kg)  12/21/16 158 lb 8 oz (71.9 kg)  01/03/16 158 lb 4 oz (71.8  kg)    General: Appears his stated age, well developed, well nourished in NAD. Neurological: Alert and oriented.  Psychiatric: Mood and affect normal. Behavior is normal. Judgment and thought content normal.     BMET    Component Value Date/Time   NA 140 12/21/2016 0925   K 4.7 12/21/2016 0925   CL 103 12/21/2016 0925   CO2 29 12/21/2016 0925   GLUCOSE 91 12/21/2016 0925   BUN 20 12/21/2016 0925   CREATININE 0.79 12/21/2016 0925   CALCIUM 10.0 12/21/2016 0925    Lipid Panel     Component Value Date/Time   CHOL 207 (H) 12/21/2016 0925   TRIG 82.0 12/21/2016 0925   HDL 48.50 12/21/2016 0925   CHOLHDL 4 12/21/2016 0925   VLDL 16.4 12/21/2016 0925   LDLCALC 142 (H) 12/21/2016 0925    CBC    Component Value Date/Time   WBC 5.5 12/21/2016 0925   RBC 5.08 12/21/2016 0925   HGB 15.1 12/21/2016 0925   HCT 45.3 12/21/2016 0925   PLT 303.0 12/21/2016 0925   MCV 89.2 12/21/2016 0925   MCHC 33.3 12/21/2016 0925   RDW 12.7 12/21/2016 0925    Hgb A1C No results found for: HGBA1C         Assessment & Plan:   Anxiety:  Support offered today He will look for a therapist because he is interested in CBT Advised him it would be okay to take Hydroxyzine up to TID for short period of time  If having issues for longer periods of time, would need to consider SSRI vs Buspar  Return precautions discussed Nicki Reaper, NP

## 2018-04-03 ENCOUNTER — Ambulatory Visit (INDEPENDENT_AMBULATORY_CARE_PROVIDER_SITE_OTHER): Payer: BLUE CROSS/BLUE SHIELD | Admitting: Internal Medicine

## 2018-04-03 ENCOUNTER — Encounter: Payer: Self-pay | Admitting: Internal Medicine

## 2018-04-03 VITALS — BP 110/70 | HR 76 | Temp 97.9°F | Ht 66.25 in | Wt 159.0 lb

## 2018-04-03 DIAGNOSIS — Z0000 Encounter for general adult medical examination without abnormal findings: Secondary | ICD-10-CM | POA: Diagnosis not present

## 2018-04-03 MED ORDER — HYDROXYZINE HCL 10 MG/5ML PO SYRP: 25.0000 mg | ORAL_SOLUTION | Freq: Three times a day (TID) | ORAL | 2 refills | Status: DC

## 2018-04-03 NOTE — Patient Instructions (Signed)

## 2018-04-03 NOTE — Progress Notes (Signed)
Subjective:    Patient ID: Jack Hicks, male    DOB: 15-Sep-1990, 27 y.o.   MRN: [id number]  HPI  Pt presents to the clinic today for his annual exam.  Flu: 01/2018 Tetanus: 05/2015 Dentist: annually  Diet: He does eat meat. He consumes fruits and veggies daily. He does eat fried foods. He drinks mostly water. Exercise: None  Review of Systems      Past Medical History:  Diagnosis Date  . Allergy   . Heart murmur    as child  . History of chicken pox   . Thyroid disease    borderline Hypothyroidism     Current Outpatient Medications  Medication Sig Dispense Refill  . hydrOXYzine (ATARAX) 10 MG/5ML syrup Take 12.5 mLs by mouth 3 (three) times daily.  2   No current facility-administered medications for this visit.     No Known Allergies  Family History  Problem Relation Age of Onset  . Hyperlipidemia Mother   . Hypertension Mother   . Hyperlipidemia Father   . Hypertension Father   . Hyperlipidemia Maternal Grandmother   . Hypertension Maternal Grandmother   . Prostate cancer Maternal Grandfather   . Hyperlipidemia Maternal Grandfather   . Stroke Maternal Grandfather   . Hypertension Maternal Grandfather   . Hyperlipidemia Paternal Grandmother   . Hypertension Paternal Grandmother   . Arthritis Paternal Grandfather   . Hyperlipidemia Paternal Grandfather   . Hypertension Paternal Grandfather     Social History   Socioeconomic History  . Marital status: Single    Spouse name: Not on file  . Number of children: Not on file  . Years of education: Not on file  . Highest education level: Not on file  Occupational History  . Not on file  Social Needs  . Financial resource strain: Not on file  . Food insecurity:    Worry: Not on file    Inability: Not on file  . Transportation needs:    Medical: Not on file    Non-medical: Not on file  Tobacco Use  . Smoking status: Never Smoker  . Smokeless tobacco: Never Used  Substance and Sexual Activity    . Alcohol use: Yes    Alcohol/week: 1.0 standard drinks    Types: 1 Standard drinks or equivalent per week    Comment: occasional  . Drug use: No  . Sexual activity: Yes  Lifestyle  . Physical activity:    Days per week: Not on file    Minutes per session: Not on file  . Stress: Not on file  Relationships  . Social connections:    Talks on phone: Not on file    Gets together: Not on file    Attends religious service: Not on file    Active member of club or organization: Not on file    Attends meetings of clubs or organizations: Not on file    Relationship status: Not on file  . Intimate partner violence:    Fear of current or ex partner: Not on file    Emotionally abused: Not on file    Physically abused: Not on file    Forced sexual activity: Not on file  Other Topics Concern  . Not on file  Social History Narrative  . Not on file     Constitutional: Denies fever, malaise, fatigue, headache or abrupt weight changes.  HEENT: Denies eye pain, eye redness, ear pain, ringing in the ears, wax buildup, runny nose, nasal congestion, bloody  nose, or sore throat. Respiratory: Denies difficulty breathing, shortness of breath, cough or sputum production.   Cardiovascular: Denies chest pain, chest tightness, palpitations or swelling in the hands or feet.  Gastrointestinal: Pt reports intermittent constipation.Denies abdominal pain, bloating, diarrhea or blood in the stool.  GU: Denies urgency, frequency, pain with urination, burning sensation, blood in urine, odor or discharge. Musculoskeletal: Denies decrease in range of motion, difficulty with gait, muscle pain or joint pain and swelling.  Skin: Denies redness, rashes, lesions or ulcercations.  Neurological: Denies dizziness, difficulty with memory, difficulty with speech or problems with balance and coordination.  Psych: Denies anxiety, depression, SI/HI.  No other specific complaints in a complete review of systems (except as  listed in HPI above).  Objective:   Physical Exam   BP 110/70   Pulse 76   Temp 97.9 F (36.6 C) (Oral)   Ht 5' 6.25" (1.683 m)   Wt 159 lb (72.1 kg)   SpO2 99%   BMI 25.47 kg/m  Wt Readings from Last 3 Encounters:  04/03/18 159 lb (72.1 kg)  07/18/17 155 lb (70.3 kg)  12/21/16 158 lb 8 oz (71.9 kg)    General: Appears his stated age, well developed, well nourished in NAD. Skin: Warm, dry and intact.  HEENT: Head: normal shape and size; Eyes: sclera white, no icterus, conjunctiva pink, PERRLA and EOMs intact; Ears: Tm's gray and intact, normal light reflex;Throat/Mouth: Teeth present, mucosa pink and moist, no exudate, lesions or ulcerations noted.  Neck:  Neck supple, trachea midline. No masses, lumps or thyromegaly present.  Cardiovascular: Normal rate and rhythm. S1,S2 noted.  No murmur, rubs or gallops noted. No JVD or BLE edema.  Pulmonary/Chest: Normal effort and positive vesicular breath sounds. No respiratory distress. No wheezes, rales or ronchi noted.  Abdomen: Soft and nontender. Normal bowel sounds. No distention or masses noted. Liver, spleen and kidneys non palpable. Musculoskeletal: Strength 5/ 5BUE/BLE. No difficulty with gait.  Neurological: Alert and oriented. Cranial nerves II-XII grossly intact. Coordination normal.  Psychiatric: Mood and affect normal. Behavior is normal. Judgment and thought content normal.    BMET    Component Value Date/Time   NA 140 12/21/2016 0925   K 4.7 12/21/2016 0925   CL 103 12/21/2016 0925   CO2 29 12/21/2016 0925   GLUCOSE 91 12/21/2016 0925   BUN 20 12/21/2016 0925   CREATININE 0.79 12/21/2016 0925   CALCIUM 10.0 12/21/2016 0925    Lipid Panel     Component Value Date/Time   CHOL 207 (H) 12/21/2016 0925   TRIG 82.0 12/21/2016 0925   HDL 48.50 12/21/2016 0925   CHOLHDL 4 12/21/2016 0925   VLDL 16.4 12/21/2016 0925   LDLCALC 142 (H) 12/21/2016 0925    CBC    Component Value Date/Time   WBC 5.5 12/21/2016 0925    RBC 5.08 12/21/2016 0925   HGB 15.1 12/21/2016 0925   HCT 45.3 12/21/2016 0925   PLT 303.0 12/21/2016 0925   MCV 89.2 12/21/2016 0925   MCHC 33.3 12/21/2016 0925   RDW 12.7 12/21/2016 0925    Hgb A1C No results found for: HGBA1C         Assessment & Plan:   Preventative Health Maintenance:  Flu shot UTD Tetanus UTD Encouraged him to consume a balanced diet and exercise regimen Advised him to see a dentist annually Will check CBC, CMET, TSH, Lipid profile today  RTC in 1 year, sooner if needed Nicki Reaperegina Deyra Perdomo, NP

## 2018-04-04 LAB — CBC
HEMATOCRIT: 46 % (ref 39.0–52.0)
Hemoglobin: 15.8 g/dL (ref 13.0–17.0)
MCHC: 34.2 g/dL (ref 30.0–36.0)
MCV: 86.7 fl (ref 78.0–100.0)
PLATELETS: 316 10*3/uL (ref 150.0–400.0)
RBC: 5.31 Mil/uL (ref 4.22–5.81)
RDW: 12.9 % (ref 11.5–15.5)
WBC: 8.1 10*3/uL (ref 4.0–10.5)

## 2018-04-04 LAB — COMPREHENSIVE METABOLIC PANEL
ALBUMIN: 4.7 g/dL (ref 3.5–5.2)
ALT: 22 U/L (ref 0–53)
AST: 16 U/L (ref 0–37)
Alkaline Phosphatase: 72 U/L (ref 39–117)
BUN: 16 mg/dL (ref 6–23)
CALCIUM: 9.6 mg/dL (ref 8.4–10.5)
CHLORIDE: 102 meq/L (ref 96–112)
CO2: 30 meq/L (ref 19–32)
Creatinine, Ser: 0.81 mg/dL (ref 0.40–1.50)
GFR: 121.24 mL/min (ref >60.00)
Glucose, Bld: 87 mg/dL (ref 70–99)
Potassium: 3.9 mEq/L (ref 3.5–5.1)
Sodium: 141 mEq/L (ref 135–145)
Total Bilirubin: 0.3 mg/dL (ref 0.2–1.2)
Total Protein: 7.6 g/dL (ref 6.0–8.3)

## 2018-04-04 LAB — LIPID PANEL
CHOLESTEROL: 200 mg/dL (ref 0–200)
HDL: 44 mg/dL (ref >39.00)
LDL CALC: 124 mg/dL — AB (ref 0–99)
NonHDL: 155.91
TRIGLYCERIDES: 161 mg/dL — AB (ref 0.0–149.0)
Total CHOL/HDL Ratio: 5
VLDL: 32.2 mg/dL (ref 0.0–40.0)

## 2018-04-04 LAB — TSH: TSH: 0.96 u[IU]/mL (ref 0.35–4.50)

## 2018-12-17 ENCOUNTER — Other Ambulatory Visit: Payer: Self-pay | Admitting: Internal Medicine

## 2018-12-18 NOTE — Telephone Encounter (Signed)
Last filled 03/2018 with 2 refills... please advise

## 2019-04-25 ENCOUNTER — Emergency Department: Payer: 59

## 2019-04-25 ENCOUNTER — Encounter: Payer: Self-pay | Admitting: Internal Medicine

## 2019-04-25 ENCOUNTER — Emergency Department
Admission: EM | Admit: 2019-04-25 | Discharge: 2019-04-25 | Disposition: A | Discharge status: Home / Self Care | Payer: 59 | Attending: Emergency Medicine | Admitting: Emergency Medicine

## 2019-04-25 ENCOUNTER — Encounter: Payer: Self-pay | Admitting: Emergency Medicine

## 2019-04-25 ENCOUNTER — Other Ambulatory Visit: Payer: Self-pay

## 2019-04-25 DIAGNOSIS — Z79899 Other long term (current) drug therapy: Secondary | ICD-10-CM | POA: Diagnosis not present

## 2019-04-25 DIAGNOSIS — R109 Unspecified abdominal pain: Secondary | ICD-10-CM

## 2019-04-25 DIAGNOSIS — N200 Calculus of kidney: Secondary | ICD-10-CM | POA: Insufficient documentation

## 2019-04-25 DIAGNOSIS — R1032 Left lower quadrant pain: Secondary | ICD-10-CM | POA: Diagnosis present

## 2019-04-25 LAB — URINALYSIS, COMPLETE (UACMP) WITH MICROSCOPIC
Bacteria, UA: NONE SEEN
Bilirubin Urine: NEGATIVE
Glucose, UA: NEGATIVE mg/dL
Hgb urine dipstick: NEGATIVE
Ketones, ur: NEGATIVE mg/dL
Leukocytes,Ua: NEGATIVE
Nitrite: NEGATIVE
Protein, ur: 30 mg/dL — AB
Specific Gravity, Urine: 1.023 (ref 1.005–1.030)
pH: 6 (ref 5.0–8.0)

## 2019-04-25 LAB — BASIC METABOLIC PANEL
Anion gap: 13 (ref 5–15)
BUN: 19 mg/dL (ref 6–20)
CO2: 21 mmol/L — ABNORMAL LOW (ref 22–32)
Calcium: 9.6 mg/dL (ref 8.9–10.3)
Chloride: 102 mmol/L (ref 98–111)
Creatinine, Ser: 0.89 mg/dL (ref 0.61–1.24)
GFR calc Af Amer: >60 mL/min (ref >60)
GFR calc non Af Amer: >60 mL/min (ref >60)
Glucose, Bld: 145 mg/dL — ABNORMAL HIGH (ref 70–99)
Potassium: 3.4 mmol/L — ABNORMAL LOW (ref 3.5–5.1)
Sodium: 136 mmol/L (ref 135–145)

## 2019-04-25 LAB — CBC
HCT: 46.8 % (ref 39.0–52.0)
Hemoglobin: 15.7 g/dL (ref 13.0–17.0)
MCH: 28.5 pg (ref 26.0–34.0)
MCHC: 33.5 g/dL (ref 30.0–36.0)
MCV: 85.1 fL (ref 80.0–100.0)
Platelets: 328 10*3/uL (ref 150–400)
RBC: 5.5 MIL/uL (ref 4.22–5.81)
RDW: 11.8 % (ref 11.5–15.5)
WBC: 9 10*3/uL (ref 4.0–10.5)
nRBC: 0 % (ref 0.0–0.2)

## 2019-04-25 MED ORDER — KETOROLAC TROMETHAMINE 30 MG/ML IJ SOLN
30.0000 mg | Freq: Once | INTRAMUSCULAR | Status: AC
  Administered 2019-04-25: 30 mg via INTRAVENOUS
  Filled 2019-04-25: qty 1

## 2019-04-25 MED ORDER — ONDANSETRON HCL 4 MG/2ML IJ SOLN
INTRAMUSCULAR | Status: AC
  Administered 2019-04-25: 4 mg via INTRAVENOUS
  Filled 2019-04-25: qty 2

## 2019-04-25 MED ORDER — FENTANYL CITRATE (PF) 100 MCG/2ML IJ SOLN: 50.0000 ug | Freq: Once | INTRAMUSCULAR | Status: AC

## 2019-04-25 MED ORDER — TAMSULOSIN HCL 0.4 MG PO CAPS: ORAL_CAPSULE | ORAL | 0 refills | Status: DC

## 2019-04-25 MED ORDER — FENTANYL CITRATE (PF) 100 MCG/2ML IJ SOLN
INTRAMUSCULAR | Status: AC
  Administered 2019-04-25: 50 ug via INTRAVENOUS
  Filled 2019-04-25: qty 2

## 2019-04-25 MED ORDER — HYDROMORPHONE HCL 1 MG/ML IJ SOLN
1.0000 mg | Freq: Once | INTRAMUSCULAR | Status: AC
  Administered 2019-04-25: 1 mg via INTRAVENOUS
  Filled 2019-04-25 (×2): qty 1

## 2019-04-25 MED ORDER — ONDANSETRON HCL 4 MG/2ML IJ SOLN
4.0000 mg | Freq: Once | INTRAMUSCULAR | Status: AC
  Administered 2019-04-25: 4 mg via INTRAVENOUS
  Filled 2019-04-25: qty 2

## 2019-04-25 MED ORDER — ONDANSETRON 4 MG PO TBDP: 4.0000 mg | ORAL_TABLET | Freq: Three times a day (TID) | ORAL | 0 refills | Status: DC | PRN

## 2019-04-25 MED ORDER — ONDANSETRON HCL 4 MG/2ML IJ SOLN: 4.0000 mg | Freq: Once | INTRAMUSCULAR | Status: AC

## 2019-04-25 MED ORDER — OXYCODONE-ACETAMINOPHEN 10-325 MG PO TABS: 1.0000 | ORAL_TABLET | Freq: Four times a day (QID) | ORAL | 0 refills | Status: DC | PRN

## 2019-04-25 MED ORDER — SODIUM CHLORIDE 0.9 % IV BOLUS
1000.0000 mL | Freq: Once | INTRAVENOUS | Status: AC
  Administered 2019-04-25: 1000 mL via INTRAVENOUS

## 2019-04-25 NOTE — ED Notes (Signed)
See triage note  Presents with sudden onset of flank pain this am  Also has had some n/v  States he feels like he is not emptying his bladder

## 2019-04-25 NOTE — ED Triage Notes (Signed)
Pt presents to ED via POV with c/o L flank pain that started with sudden onset this morning. Pt appears uncomfortable in triage at this time. Pt states urinary frequency at this time. Pt denies hx of kidney stones at this time. Pt also states he has a "hernia in his throat that he can't take pills".

## 2019-04-25 NOTE — Discharge Instructions (Signed)
Follow-up with your primary care provider or the urologist is listed on your discharge papers if any continued problems.  Also read the information about a low purine diet which may decrease kidney stones.  The pain medication is 1 every 6 hours if needed for moderate to severe pain.  Zofran as needed for nausea and vomiting and Flomax is a tablet that you take once a day which should help with kidney stones.  Increase fluids.  Return to the emergency department if any severe worsening of your symptoms.
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
## 2019-05-15 ENCOUNTER — Encounter: Payer: Self-pay | Admitting: Internal Medicine

## 2019-06-03 ENCOUNTER — Encounter: Payer: 59 | Admitting: Internal Medicine

## 2019-07-03 ENCOUNTER — Ambulatory Visit (INDEPENDENT_AMBULATORY_CARE_PROVIDER_SITE_OTHER): Payer: 59 | Admitting: Internal Medicine

## 2019-07-03 ENCOUNTER — Encounter: Payer: Self-pay | Admitting: Internal Medicine

## 2019-07-03 ENCOUNTER — Other Ambulatory Visit: Payer: Self-pay

## 2019-07-03 VITALS — BP 114/78 | HR 83 | Temp 97.7°F | Ht 66.33 in | Wt 160.0 lb

## 2019-07-03 DIAGNOSIS — Z0000 Encounter for general adult medical examination without abnormal findings: Secondary | ICD-10-CM | POA: Diagnosis not present

## 2019-07-03 DIAGNOSIS — F419 Anxiety disorder, unspecified: Secondary | ICD-10-CM | POA: Insufficient documentation

## 2019-07-03 LAB — CBC
HCT: 45.1 % (ref 39.0–52.0)
Hemoglobin: 15.1 g/dL (ref 13.0–17.0)
MCHC: 33.4 g/dL (ref 30.0–36.0)
MCV: 87.6 fl (ref 78.0–100.0)
Platelets: 287 10*3/uL (ref 150.0–400.0)
RBC: 5.15 Mil/uL (ref 4.22–5.81)
RDW: 13 % (ref 11.5–15.5)
WBC: 6.5 10*3/uL (ref 4.0–10.5)

## 2019-07-03 LAB — LIPID PANEL
Cholesterol: 214 mg/dL — ABNORMAL HIGH (ref 0–200)
HDL: 50.9 mg/dL (ref >39.00)
LDL Cholesterol: 144 mg/dL — ABNORMAL HIGH (ref 0–99)
NonHDL: 163.03
Total CHOL/HDL Ratio: 4
Triglycerides: 97 mg/dL (ref 0.0–149.0)
VLDL: 19.4 mg/dL (ref 0.0–40.0)

## 2019-07-03 LAB — COMPREHENSIVE METABOLIC PANEL
ALT: 29 U/L (ref 0–53)
AST: 18 U/L (ref 0–37)
Albumin: 4.5 g/dL (ref 3.5–5.2)
Alkaline Phosphatase: 87 U/L (ref 39–117)
BUN: 20 mg/dL (ref 6–23)
CO2: 31 mEq/L (ref 19–32)
Calcium: 9.7 mg/dL (ref 8.4–10.5)
Chloride: 102 mEq/L (ref 96–112)
Creatinine, Ser: 0.82 mg/dL (ref 0.40–1.50)
GFR: 111.45 mL/min (ref >60.00)
Glucose, Bld: 97 mg/dL (ref 70–99)
Potassium: 5.1 mEq/L (ref 3.5–5.1)
Sodium: 137 mEq/L (ref 135–145)
Total Bilirubin: 0.5 mg/dL (ref 0.2–1.2)
Total Protein: 7.6 g/dL (ref 6.0–8.3)

## 2019-07-03 NOTE — Patient Instructions (Signed)

## 2019-07-03 NOTE — Assessment & Plan Note (Signed)
Controlled with Hydroxyzine Will monitor

## 2019-07-03 NOTE — Progress Notes (Signed)
Subjective:    Patient ID: Jack Hicks, male    DOB: 1990-12-28, 29 y.o.   MRN: [id number]  HPI  Pt presents to the clinic today for his annual exam.  Anxiety: Managed with Hydroxyzine as needed before bed.  Flu: 01/2019 Tetanus: 05/2015 Dentist: annually  Diet: He does eat meat. He consumes more veggies than fruits. He does eat some fried foods. He drinks mostly water, some soft drinks, juice. Exercise: None  Review of Systems      Past Medical History:  Diagnosis Date  . Allergy   . Heart murmur    as child  . History of chicken pox   . Thyroid disease    borderline Hypothyroidism     Current Outpatient Medications  Medication Sig Dispense Refill  . hydrOXYzine (ATARAX) 10 MG/5ML syrup TAKE 12.5 MLS (25 MG TOTAL) BY MOUTH 3 (THREE) TIMES DAILY. 240 mL 2  . Multiple Vitamin (MULTIVITAMIN) tablet Take 1 tablet by mouth daily.    . ondansetron (ZOFRAN ODT) 4 MG disintegrating tablet Take 1 tablet (4 mg total) by mouth every 8 (eight) hours as needed for nausea or vomiting. 20 tablet 0  . oxyCODONE-acetaminophen (PERCOCET) 10-325 MG tablet Take 1 tablet by mouth every 6 (six) hours as needed for pain. 20 tablet 0  . tamsulosin (FLOMAX) 0.4 MG CAPS capsule Take 1 tablet per day 30 capsule 0   No current facility-administered medications for this visit.    No Known Allergies  Family History  Problem Relation Age of Onset  . Hyperlipidemia Mother   . Hypertension Mother   . Hyperlipidemia Father   . Hypertension Father   . Hyperlipidemia Maternal Grandmother   . Hypertension Maternal Grandmother   . Prostate cancer Maternal Grandfather   . Hyperlipidemia Maternal Grandfather   . Stroke Maternal Grandfather   . Hypertension Maternal Grandfather   . Hyperlipidemia Paternal Grandmother   . Hypertension Paternal Grandmother   . Arthritis Paternal Grandfather   . Hyperlipidemia Paternal Grandfather   . Hypertension Paternal Grandfather     Social History    Socioeconomic History  . Marital status: Married    Spouse name: Not on file  . Number of children: Not on file  . Years of education: Not on file  . Highest education level: Not on file  Occupational History  . Not on file  Tobacco Use  . Smoking status: Never Smoker  . Smokeless tobacco: Never Used  Substance and Sexual Activity  . Alcohol use: Never    Alcohol/week: 1.0 standard drinks    Types: 1 Standard drinks or equivalent per week  . Drug use: No  . Sexual activity: Yes  Other Topics Concern  . Not on file  Social History Narrative  . Not on file   Social Determinants of Health   Financial Resource Strain:   . Difficulty of Paying Living Expenses:   Food Insecurity:   . Worried About Charity fundraiser in the Last Year:   . Arboriculturist in the Last Year:   Transportation Needs:   . Film/video editor (Medical):   Marland Kitchen Lack of Transportation (Non-Medical):   Physical Activity:   . Days of Exercise per Week:   . Minutes of Exercise per Session:   Stress:   . Feeling of Stress :   Social Connections:   . Frequency of Communication with Friends and Family:   . Frequency of Social Gatherings with Friends and Family:   .  Attends Religious Services:   . Active Member of Clubs or Organizations:   . Attends Banker Meetings:   Marland Kitchen Marital Status:   Intimate Partner Violence:   . Fear of Current or Ex-Partner:   . Emotionally Abused:   Marland Kitchen Physically Abused:   . Sexually Abused:      Constitutional: Denies fever, malaise, fatigue, headache or abrupt weight changes.  HEENT: Denies eye pain, eye redness, ear pain, ringing in the ears, wax buildup, runny nose, nasal congestion, bloody nose, or sore throat. Respiratory: Denies difficulty breathing, shortness of breath, cough or sputum production.   Cardiovascular: Denies chest pain, chest tightness, palpitations or swelling in the hands or feet.  Gastrointestinal: Denies abdominal pain, bloating,  constipation, diarrhea or blood in the stool.  GU: Denies urgency, frequency, pain with urination, burning sensation, blood in urine, odor or discharge. Musculoskeletal: Denies decrease in range of motion, difficulty with gait, muscle pain or joint pain and swelling.  Skin: Denies redness, rashes, lesions or ulcercations.  Neurological: Denies dizziness, difficulty with memory, difficulty with speech or problems with balance and coordination.  Psych: Pt reports anxiety. Denies depression, SI/HI.  No other specific complaints in a complete review of systems (except as listed in HPI above).  Objective:   Physical Exam  BP 114/78   Pulse 83   Temp 97.7 F (36.5 C) (Temporal)   Ht 5' 6.33" (1.685 m)   Wt 160 lb (72.6 kg)   SpO2 98%   BMI 25.57 kg/m   Wt Readings from Last 3 Encounters:  04/25/19 150 lb (68 kg)  04/03/18 159 lb (72.1 kg)  07/18/17 155 lb (70.3 kg)    General: Appears his stated age, well developed, well nourished in NAD. Skin: Warm, dry and intact. Scar noted to left hand.  HEENT: Head: normal shape and size; Eyes: sclera white, no icterus, conjunctiva pink, PERRLA and EOMs intact;  Neck:  Neck supple, trachea midline. No masses, lumps or thyromegaly present.  Cardiovascular: Normal rate and rhythm. S1,S2 noted.  No murmur, rubs or gallops noted. No JVD or BLE edema.  Pulmonary/Chest: Normal effort and positive vesicular breath sounds. No respiratory distress. No wheezes, rales or ronchi noted.  Abdomen: Soft and nontender. Normal bowel sounds. No distention or masses noted. Liver, spleen and kidneys non palpable. Musculoskeletal: Strength 5/5 BUE/BLE. No difficulty with gait.  Neurological: Alert and oriented. Cranial nerves II-XII grossly intact. Coordination normal.  Psychiatric: Mood and affect normal. Behavior is normal. Judgment and thought content normal.    BMET    Component Value Date/Time   NA 136 04/25/2019 0817   K 3.4 (L) 04/25/2019 0817   CL 102  04/25/2019 0817   CO2 21 (L) 04/25/2019 0817   GLUCOSE 145 (H) 04/25/2019 0817   BUN 19 04/25/2019 0817   CREATININE 0.89 04/25/2019 0817   CALCIUM 9.6 04/25/2019 0817   GFRNONAA >60 04/25/2019 0817   GFRAA >60 04/25/2019 0817    Lipid Panel     Component Value Date/Time   CHOL 200 04/03/2018 1528   TRIG 161.0 (H) 04/03/2018 1528   HDL 44.00 04/03/2018 1528   CHOLHDL 5 04/03/2018 1528   VLDL 32.2 04/03/2018 1528   LDLCALC 124 (H) 04/03/2018 1528    CBC    Component Value Date/Time   WBC 9.0 04/25/2019 0817   RBC 5.50 04/25/2019 0817   HGB 15.7 04/25/2019 0817   HCT 46.8 04/25/2019 0817   PLT 328 04/25/2019 0817   MCV 85.1 04/25/2019  0817   MCH 28.5 04/25/2019 0817   MCHC 33.5 04/25/2019 0817   RDW 11.8 04/25/2019 0817    Hgb A1C No results found for: HGBA1C          Assessment & Plan:   Preventative Health Maintenance:  Flu shot UTD Tetanus UTD Encouraged him to consume a balanced diet and exercise regimen Advised him to see an eye doctor and dentist annually Will check CBC, CMET, Lipid profile today  RTC in 1 year, sooner if needed Nicki Reaper, NP This visit occurred during the SARS-CoV-2 public health emergency.  Safety protocols were in place, including screening questions prior to the visit, additional usage of staff PPE, and extensive cleaning of exam room while observing appropriate contact time as indicated for disinfecting solutions.

## 2019-07-07 ENCOUNTER — Encounter: Payer: 59 | Admitting: Internal Medicine

## 2019-09-04 ENCOUNTER — Other Ambulatory Visit: Payer: Self-pay | Admitting: Internal Medicine

## 2019-12-16 ENCOUNTER — Other Ambulatory Visit: Payer: Self-pay

## 2019-12-16 ENCOUNTER — Ambulatory Visit (INDEPENDENT_AMBULATORY_CARE_PROVIDER_SITE_OTHER): Payer: 59 | Admitting: Internal Medicine

## 2019-12-16 ENCOUNTER — Encounter: Payer: Self-pay | Admitting: Internal Medicine

## 2019-12-16 DIAGNOSIS — F419 Anxiety disorder, unspecified: Secondary | ICD-10-CM

## 2019-12-16 NOTE — Assessment & Plan Note (Signed)
Persistent Hydroxyzine too sedating He will consider Buspirone- drug information sheet given He will let me know what he decides to do Support offered

## 2019-12-16 NOTE — Patient Instructions (Signed)

## 2019-12-16 NOTE — Progress Notes (Signed)
Subjective:    Patient ID: Jack Hicks, male    DOB: 09-24-1990, 29 y.o.   MRN: [id number]  HPI  Pt presents to the clinic today for follow up anxiety. He reports increase in anxiety over the last few weeks. He had a baby, the birth was complicated and his wife had some complications post delivery. This is currently managed on Hydroxyzine prn but reports this makes him too sleepy throughout the day and he also has to help get up with the baby in the middle of the night. He reports some work stress and he is currently trying to figure that out. He denies depression, SI/HI. He is not seeing a therapist.  Review of Systems      Past Medical History:  Diagnosis Date  . Allergy   . Heart murmur    as child  . History of chicken pox   . Thyroid disease    borderline Hypothyroidism     Current Outpatient Medications  Medication Sig Dispense Refill  . hydrOXYzine (ATARAX) 10 MG/5ML syrup TAKE 12.5 MLS (25 MG TOTAL) BY MOUTH 3 (THREE) TIMES DAILY. 240 mL 2  . Multiple Vitamin (MULTIVITAMIN) tablet Take 1 tablet by mouth daily.     No current facility-administered medications for this visit.    No Known Allergies  Family History  Problem Relation Age of Onset  . Hyperlipidemia Mother   . Hypertension Mother   . Hyperlipidemia Father   . Hypertension Father   . Hyperlipidemia Maternal Grandmother   . Hypertension Maternal Grandmother   . Prostate cancer Maternal Grandfather   . Hyperlipidemia Maternal Grandfather   . Stroke Maternal Grandfather   . Hypertension Maternal Grandfather   . Hyperlipidemia Paternal Grandmother   . Hypertension Paternal Grandmother   . Arthritis Paternal Grandfather   . Hyperlipidemia Paternal Grandfather   . Hypertension Paternal Grandfather     Social History   Socioeconomic History  . Marital status: Married    Spouse name: Not on file  . Number of children: Not on file  . Years of education: Not on file  . Highest education level:  Not on file  Occupational History  . Not on file  Tobacco Use  . Smoking status: Never Smoker  . Smokeless tobacco: Never Used  Substance and Sexual Activity  . Alcohol use: Never    Alcohol/week: 1.0 standard drink    Types: 1 Standard drinks or equivalent per week  . Drug use: No  . Sexual activity: Yes  Other Topics Concern  . Not on file  Social History Narrative  . Not on file   Social Determinants of Health   Financial Resource Strain:   . Difficulty of Paying Living Expenses: Not on file  Food Insecurity:   . Worried About Programme researcher, broadcasting/film/video in the Last Year: Not on file  . Ran Out of Food in the Last Year: Not on file  Transportation Needs:   . Lack of Transportation (Medical): Not on file  . Lack of Transportation (Non-Medical): Not on file  Physical Activity:   . Days of Exercise per Week: Not on file  . Minutes of Exercise per Session: Not on file  Stress:   . Feeling of Stress : Not on file  Social Connections:   . Frequency of Communication with Friends and Family: Not on file  . Frequency of Social Gatherings with Friends and Family: Not on file  . Attends Religious Services: Not on file  . Active  Member of Clubs or Organizations: Not on file  . Attends Banker Meetings: Not on file  . Marital Status: Not on file  Intimate Partner Violence:   . Fear of Current or Ex-Partner: Not on file  . Emotionally Abused: Not on file  . Physically Abused: Not on file  . Sexually Abused: Not on file     Constitutional: Denies fever, malaise, fatigue, headache or abrupt weight changes.  Respiratory: Denies difficulty breathing, shortness of breath, cough or sputum production.   Cardiovascular: Denies chest pain, chest tightness, palpitations or swelling in the hands or feet.  Neurological: Denies dizziness, difficulty with memory, difficulty with speech or problems with balance and coordination.  Psych: Pt reports anxiety. Denies depression,  SI/HI.  No other specific complaints in a complete review of systems (except as listed in HPI above).  Objective:   Physical Exam  BP 120/70   Pulse 81   Temp 98.5 F (36.9 C) (Temporal)   Wt 160 lb (72.6 kg)   SpO2 98%   BMI 25.57 kg/m   Wt Readings from Last 3 Encounters:  07/03/19 160 lb (72.6 kg)  04/25/19 150 lb (68 kg)  04/03/18 159 lb (72.1 kg)    General: Appears his stated age, well developed, well nourished in NAD. Cardiovascular: Normal rate Pulmonary/Chest: Normal effort. Neurological: Alert and oriented.  Psychiatric: Mood and affect normal. Behavior is normal. Judgment and thought content normal.     BMET    Component Value Date/Time   NA 137 07/03/2019 1204   K 5.1 07/03/2019 1204   CL 102 07/03/2019 1204   CO2 31 07/03/2019 1204   GLUCOSE 97 07/03/2019 1204   BUN 20 07/03/2019 1204   CREATININE 0.82 07/03/2019 1204   CALCIUM 9.7 07/03/2019 1204   GFRNONAA >60 04/25/2019 0817   GFRAA >60 04/25/2019 0817    Lipid Panel     Component Value Date/Time   CHOL 214 (H) 07/03/2019 1204   TRIG 97.0 07/03/2019 1204   HDL 50.90 07/03/2019 1204   CHOLHDL 4 07/03/2019 1204   VLDL 19.4 07/03/2019 1204   LDLCALC 144 (H) 07/03/2019 1204    CBC    Component Value Date/Time   WBC 6.5 07/03/2019 1204   RBC 5.15 07/03/2019 1204   HGB 15.1 07/03/2019 1204   HCT 45.1 07/03/2019 1204   PLT 287.0 07/03/2019 1204   MCV 87.6 07/03/2019 1204   MCH 28.5 04/25/2019 0817   MCHC 33.4 07/03/2019 1204   RDW 13.0 07/03/2019 1204    Hgb A1C No results found for: HGBA1C        Assessment & Plan:   Nicki Reaper, NP  This visit occurred during the SARS-CoV-2 public health emergency.  Safety protocols were in place, including screening questions prior to the visit, additional usage of staff PPE, and extensive cleaning of exam room while observing appropriate contact time as indicated for disinfecting solutions.

## 2020-06-03 ENCOUNTER — Other Ambulatory Visit: Payer: Self-pay | Admitting: Internal Medicine

## 2020-07-03 ENCOUNTER — Encounter: Payer: Self-pay | Admitting: Internal Medicine

## 2020-09-02 ENCOUNTER — Telehealth (INDEPENDENT_AMBULATORY_CARE_PROVIDER_SITE_OTHER): Payer: 59 | Admitting: Internal Medicine

## 2020-09-02 ENCOUNTER — Encounter: Payer: Self-pay | Admitting: Internal Medicine

## 2020-09-02 ENCOUNTER — Other Ambulatory Visit: Payer: Self-pay

## 2020-09-02 DIAGNOSIS — J0120 Acute ethmoidal sinusitis, unspecified: Secondary | ICD-10-CM

## 2020-09-02 MED ORDER — AMOXICILLIN-POT CLAVULANATE 400-57 MG/5ML PO SUSR: 875.0000 mg | Freq: Two times a day (BID) | ORAL | 0 refills | Status: AC

## 2020-09-02 NOTE — Patient Instructions (Signed)

## 2020-09-02 NOTE — Progress Notes (Signed)
Virtual Visit via Video Note  I connected with Jack Hicks on 09/02/20 at 10:00 AM EDT by a video enabled telemedicine application and verified that I am speaking with the correct person using two identifiers.  Location: Patient: Home Provider: Office  Persons participating in this video call: Nicki Reaper, NP and Margarita Rana   I discussed the limitations of evaluation and management by telemedicine and the availability of in person appointments. The patient expressed understanding and agreed to proceed.  History of Present Illness:  Patient reports facial pain and pressure, runny nose, nasal congestion, hoarseness, sore throat and cough. He reports this started 1 week ago. The facial pain is at the top of his nasal bridge. He is blowing green mucous out of his nose. He denies difficulty swallowing. The cough is productive of blood tinged green mucous. He denies headache, ear pain, shortness of breath, nausea, vomiting, diarrhea, fever, chills or body aches. He has tried Zyrtec, Afrin and Robitussin OTC as needed with minimal relief of symptoms. He has had sick contacts at home and at work with similar symptoms. He has not taken a Covid test.    Past Medical History:  Diagnosis Date  . Allergy   . Heart murmur    as child  . History of chicken pox   . Thyroid disease    borderline Hypothyroidism     Current Outpatient Medications  Medication Sig Dispense Refill  . hydrOXYzine (ATARAX) 10 MG/5ML syrup TAKE 12.5 MLS (25 MG TOTAL) BY MOUTH 3 (THREE) TIMES DAILY. 240 mL 0  . Multiple Vitamin (MULTIVITAMIN) tablet Take 1 tablet by mouth daily.     No current facility-administered medications for this visit.    No Known Allergies  Family History  Problem Relation Age of Onset  . Hyperlipidemia Mother   . Hypertension Mother   . Hyperlipidemia Father   . Hypertension Father   . Hyperlipidemia Maternal Grandmother   . Hypertension Maternal Grandmother   . Prostate cancer  Maternal Grandfather   . Hyperlipidemia Maternal Grandfather   . Stroke Maternal Grandfather   . Hypertension Maternal Grandfather   . Hyperlipidemia Paternal Grandmother   . Hypertension Paternal Grandmother   . Arthritis Paternal Grandfather   . Hyperlipidemia Paternal Grandfather   . Hypertension Paternal Grandfather     Social History   Socioeconomic History  . Marital status: Married    Spouse name: Not on file  . Number of children: Not on file  . Years of education: Not on file  . Highest education level: Not on file  Occupational History  . Not on file  Tobacco Use  . Smoking status: Never Smoker  . Smokeless tobacco: Never Used  Substance and Sexual Activity  . Alcohol use: Never    Alcohol/week: 1.0 standard drink    Types: 1 Standard drinks or equivalent per week  . Drug use: No  . Sexual activity: Yes  Other Topics Concern  . Not on file  Social History Narrative  . Not on file   Social Determinants of Health   Financial Resource Strain: Not on file  Food Insecurity: Not on file  Transportation Needs: Not on file  Physical Activity: Not on file  Stress: Not on file  Social Connections: Not on file  Intimate Partner Violence: Not on file     Constitutional: Denies fever, malaise, fatigue, headache or abrupt weight changes.  HEENT: Pt reports facial pain and pressure, runny nose, nasal congestion and sore throat. Denies eye pain,  eye redness, ear pain, ringing in the ears, wax buildup, bloody nose. Respiratory: Pt reports cough. Denies difficulty breathing, shortness of breath.   Cardiovascular: Denies chest pain, chest tightness, palpitations or swelling in the hands or feet.  Gastrointestinal: Denies abdominal pain, bloating, constipation, diarrhea or blood in the stool.   No other specific complaints in a complete review of systems (except as listed in HPI above).  Observations/Objective:   Wt Readings from Last 3 Encounters:  12/16/19 160 lb  (72.6 kg)  07/03/19 160 lb (72.6 kg)  04/25/19 150 lb (68 kg)    General: Appears his stated age, well developed, well nourished in NAD. HEENT: Head: normal shape and size, pt reports to ethmoidal sinus pain;  Nose: congestion noted; Throat/Mouth: hoarseness noted. Pulmonary/Chest: Normal effort. No respiratory distress. No wheezes, rales or ronchi noted.  Neurological: Alert and oriented.   BMET    Component Value Date/Time   NA 137 07/03/2019 1204   K 5.1 07/03/2019 1204   CL 102 07/03/2019 1204   CO2 31 07/03/2019 1204   GLUCOSE 97 07/03/2019 1204   BUN 20 07/03/2019 1204   CREATININE 0.82 07/03/2019 1204   CALCIUM 9.7 07/03/2019 1204   GFRNONAA >60 04/25/2019 0817   GFRAA >60 04/25/2019 0817    Lipid Panel     Component Value Date/Time   CHOL 214 (H) 07/03/2019 1204   TRIG 97.0 07/03/2019 1204   HDL 50.90 07/03/2019 1204   CHOLHDL 4 07/03/2019 1204   VLDL 19.4 07/03/2019 1204   LDLCALC 144 (H) 07/03/2019 1204    CBC    Component Value Date/Time   WBC 6.5 07/03/2019 1204   RBC 5.15 07/03/2019 1204   HGB 15.1 07/03/2019 1204   HCT 45.1 07/03/2019 1204   PLT 287.0 07/03/2019 1204   MCV 87.6 07/03/2019 1204   MCH 28.5 04/25/2019 0817   MCHC 33.4 07/03/2019 1204   RDW 13.0 07/03/2019 1204    Hgb A1C No results found for: HGBA1C     Assessment and Plan:  Acute Bacterial Sinusitis:  Continue Zyrtec Stop Aftrin, use Flonase RX for Augmentin 875-125 PO BID x 10 days Advised him to do a home Covid test and let me know if negative or positive  Return precautions discussed  Follow Up Instructions:    I discussed the assessment and treatment plan with the patient. The patient was provided an opportunity to ask questions and all were answered. The patient agreed with the plan and demonstrated an understanding of the instructions.   The patient was advised to call back or seek an in-person evaluation if the symptoms worsen or if the condition fails to  improve as anticipated.    Nicki Reaper, NP

## 2020-09-21 ENCOUNTER — Ambulatory Visit (INDEPENDENT_AMBULATORY_CARE_PROVIDER_SITE_OTHER): Payer: 59 | Admitting: Internal Medicine

## 2020-09-21 ENCOUNTER — Other Ambulatory Visit: Payer: Self-pay

## 2020-09-21 ENCOUNTER — Encounter: Payer: Self-pay | Admitting: Internal Medicine

## 2020-09-21 VITALS — BP 127/73 | HR 90 | Temp 97.5°F | Resp 17 | Ht 66.33 in | Wt 170.8 lb

## 2020-09-21 DIAGNOSIS — R6882 Decreased libido: Secondary | ICD-10-CM

## 2020-09-21 DIAGNOSIS — R5383 Other fatigue: Secondary | ICD-10-CM

## 2020-09-21 DIAGNOSIS — L659 Nonscarring hair loss, unspecified: Secondary | ICD-10-CM

## 2020-09-21 DIAGNOSIS — Z0000 Encounter for general adult medical examination without abnormal findings: Secondary | ICD-10-CM

## 2020-09-21 DIAGNOSIS — IMO0002 Reserved for concepts with insufficient information to code with codable children: Secondary | ICD-10-CM

## 2020-09-21 DIAGNOSIS — F419 Anxiety disorder, unspecified: Secondary | ICD-10-CM | POA: Diagnosis not present

## 2020-09-21 DIAGNOSIS — Z8616 Personal history of COVID-19: Secondary | ICD-10-CM

## 2020-09-21 NOTE — Progress Notes (Signed)
Subjective:    Patient ID: Jack Hicks, male    DOB: 1990/09/10, 30 y.o.   MRN: [id number]  HPI  Pt presents to the clinic today for his annual exam.  Anxiety: Managed with Hydroxyzine as needed. He is not currently seeing a therapist. He denies SI/HI.  Flu: 01/2020 Tetanus: 05/2015 Covid: Gerty x 2 Dentist: as needed  Diet: He does eat meat. He consume more veggies than fruit. He does eat some fried foods. He drinks mostly water, intermittent soda. Exercise: None  Review of Systems      Past Medical History:  Diagnosis Date  . Allergy   . Heart murmur    as child  . History of chicken pox   . Thyroid disease    borderline Hypothyroidism     Current Outpatient Medications  Medication Sig Dispense Refill  . cetirizine (ZYRTEC) 10 MG chewable tablet Chew 10 mg by mouth daily.    Marland Kitchen guaiFENesin (ROBITUSSIN) 100 MG/5ML SOLN Take 5 mLs by mouth every 4 (four) hours as needed for cough or to loosen phlegm.    . hydrOXYzine (ATARAX) 10 MG/5ML syrup TAKE 12.5 MLS (25 MG TOTAL) BY MOUTH 3 (THREE) TIMES DAILY. (Patient not taking: Reported on 09/02/2020) 240 mL 0  . Multiple Vitamin (MULTIVITAMIN) tablet Take 1 tablet by mouth daily. (Patient not taking: Reported on 09/02/2020)     No current facility-administered medications for this visit.    No Known Allergies  Family History  Problem Relation Age of Onset  . Hyperlipidemia Mother   . Hypertension Mother   . Hyperlipidemia Father   . Hypertension Father   . Hyperlipidemia Maternal Grandmother   . Hypertension Maternal Grandmother   . Prostate cancer Maternal Grandfather   . Hyperlipidemia Maternal Grandfather   . Stroke Maternal Grandfather   . Hypertension Maternal Grandfather   . Hyperlipidemia Paternal Grandmother   . Hypertension Paternal Grandmother   . Arthritis Paternal Grandfather   . Hyperlipidemia Paternal Grandfather   . Hypertension Paternal Grandfather     Social History   Socioeconomic  History  . Marital status: Married    Spouse name: Not on file  . Number of children: Not on file  . Years of education: Not on file  . Highest education level: Not on file  Occupational History  . Not on file  Tobacco Use  . Smoking status: Never Smoker  . Smokeless tobacco: Never Used  Substance and Sexual Activity  . Alcohol use: Never    Alcohol/week: 1.0 standard drink    Types: 1 Standard drinks or equivalent per week  . Drug use: No  . Sexual activity: Yes  Other Topics Concern  . Not on file  Social History Narrative  . Not on file   Social Determinants of Health   Financial Resource Strain: Not on file  Food Insecurity: Not on file  Transportation Needs: Not on file  Physical Activity: Not on file  Stress: Not on file  Social Connections: Not on file  Intimate Partner Violence: Not on file     Constitutional: Patient reports fatigue.  Denies fever, malaise, headache or abrupt weight changes.  HEENT: Denies eye pain, eye redness, ear pain, ringing in the ears, wax buildup, runny nose, nasal congestion, bloody nose, or sore throat. Respiratory: Denies difficulty breathing, shortness of breath, cough or sputum production.   Cardiovascular: Denies chest pain, chest tightness, palpitations or swelling in the hands or feet.  Gastrointestinal: Denies abdominal pain, bloating, constipation, diarrhea or  blood in the stool.  GU: Patient reports decreased sex drive, difficulty with orgasm at times.  Denies urgency, frequency, pain with urination, burning sensation, blood in urine, odor or discharge. Musculoskeletal: Denies decrease in range of motion, difficulty with gait, muscle pain or joint pain and swelling.  Skin: Patient reports hair thinning.  Denies redness, rashes, lesions or ulcercations.  Neurological: Denies dizziness, difficulty with memory, difficulty with speech or problems with balance and coordination.  Psych: Pt has a history of anxiety. Denies depression,  SI/HI.  No other specific complaints in a complete review of systems (except as listed in HPI above).  Objective:   Physical Exam   BP 127/73 (BP Location: Right Arm, Patient Position: Sitting, Cuff Size: Normal)   Pulse 90   Temp (!) 97.5 F (36.4 C) (Temporal)   Resp 17   Ht 5' 6.33" (1.685 m)   Wt 170 lb 12.8 oz (77.5 kg)   SpO2 99%   BMI 27.29 kg/m   Wt Readings from Last 3 Encounters:  12/16/19 160 lb (72.6 kg)  07/03/19 160 lb (72.6 kg)  04/25/19 150 lb (68 kg)    General: Appears his stated age, well developed, well nourished in NAD. Skin: Warm, dry and intact. No rashes noted. HEENT: Head: normal shape and size; Eyes: sclera white and EOMs intact;  Neck:  Neck supple, trachea midline. No masses, lumps or thyromegaly present.  Cardiovascular: Normal rate and rhythm. S1,S2 noted.  No murmur, rubs or gallops noted. No JVD or BLE edema.  Pulmonary/Chest: Normal effort and positive vesicular breath sounds. No respiratory distress. No wheezes, rales or ronchi noted.  Abdomen: Soft and nontender. Normal bowel sounds. No distention or masses noted. Liver, spleen and kidneys non palpable. Musculoskeletal: Strength 5/5 BUE/BLE. No difficulty with gait.  Neurological: Alert and oriented. Cranial nerves II-XII grossly intact. Coordination normal.  Psychiatric: Mood and affect normal. Behavior is normal. Judgment and thought content normal.    BMET    Component Value Date/Time   NA 137 07/03/2019 1204   K 5.1 07/03/2019 1204   CL 102 07/03/2019 1204   CO2 31 07/03/2019 1204   GLUCOSE 97 07/03/2019 1204   BUN 20 07/03/2019 1204   CREATININE 0.82 07/03/2019 1204   CALCIUM 9.7 07/03/2019 1204   GFRNONAA >60 04/25/2019 0817   GFRAA >60 04/25/2019 0817    Lipid Panel     Component Value Date/Time   CHOL 214 (H) 07/03/2019 1204   TRIG 97.0 07/03/2019 1204   HDL 50.90 07/03/2019 1204   CHOLHDL 4 07/03/2019 1204   VLDL 19.4 07/03/2019 1204   LDLCALC 144 (H) 07/03/2019  1204    CBC    Component Value Date/Time   WBC 6.5 07/03/2019 1204   RBC 5.15 07/03/2019 1204   HGB 15.1 07/03/2019 1204   HCT 45.1 07/03/2019 1204   PLT 287.0 07/03/2019 1204   MCV 87.6 07/03/2019 1204   MCH 28.5 04/25/2019 0817   MCHC 33.4 07/03/2019 1204   RDW 13.0 07/03/2019 1204    Hgb A1C No results found for: HGBA1C        Assessment & Plan:   Preventative Health Maintenance:  Encouraged him to get a flu shot in the fall Tetanus UTD Encouraged him to get his COVID booster Encouraged him to consume a balanced diet and exercise regimen Advised him to see a dentist annually Will check CBC, CMET, TSH, Lipid, A1C  Fatigue, Decreased Sex Drive, Difficulty with Orgasm, Hair Thinning, History of COVID:  DDx include post COVID symptoms, somatic symptom secondary to anxiety and stress, hypothyroidism, vitamin deficiency, hypogonadism We will check CBC, c-Met, TSH, testosterone, vitamin D, B12  RTC in 1 year, sooner if needed Webb Silversmith, NP This visit occurred during the SARS-CoV-2 public health emergency.  Safety protocols were in place, including screening questions prior to the visit, additional usage of staff PPE, and extensive cleaning of exam room while observing appropriate contact time as indicated for disinfecting solutions.

## 2020-09-22 ENCOUNTER — Other Ambulatory Visit: Payer: 59

## 2020-09-22 NOTE — Assessment & Plan Note (Signed)
Continue Hydroxyzine as needed Support offered 

## 2020-09-22 NOTE — Patient Instructions (Signed)

## 2020-09-23 ENCOUNTER — Encounter: Payer: Self-pay | Admitting: Internal Medicine

## 2020-09-23 LAB — COMPREHENSIVE METABOLIC PANEL
AG Ratio: 1.7 (calc) (ref 1.0–2.5)
ALT: 27 U/L (ref 9–46)
AST: 19 U/L (ref 10–40)
Albumin: 4.9 g/dL (ref 3.6–5.1)
Alkaline phosphatase (APISO): 90 U/L (ref 36–130)
BUN: 16 mg/dL (ref 7–25)
CO2: 30 mmol/L (ref 20–32)
Calcium: 9.9 mg/dL (ref 8.6–10.3)
Chloride: 102 mmol/L (ref 98–110)
Creat: 0.82 mg/dL (ref 0.60–1.35)
Globulin: 2.9 g/dL (calc) (ref 1.9–3.7)
Glucose, Bld: 88 mg/dL (ref 65–99)
Potassium: 4.4 mmol/L (ref 3.5–5.3)
Sodium: 139 mmol/L (ref 135–146)
Total Bilirubin: 0.6 mg/dL (ref 0.2–1.2)
Total Protein: 7.8 g/dL (ref 6.1–8.1)

## 2020-09-23 LAB — CBC
HCT: 46.8 % (ref 38.5–50.0)
Hemoglobin: 15.5 g/dL (ref 13.2–17.1)
MCH: 29.3 pg (ref 27.0–33.0)
MCHC: 33.1 g/dL (ref 32.0–36.0)
MCV: 88.5 fL (ref 80.0–100.0)
MPV: 9.4 fL (ref 7.5–12.5)
Platelets: 314 10*3/uL (ref 140–400)
RBC: 5.29 10*6/uL (ref 4.20–5.80)
RDW: 12.9 % (ref 11.0–15.0)
WBC: 4.7 10*3/uL (ref 3.8–10.8)

## 2020-09-23 LAB — HEMOGLOBIN A1C
Hgb A1c MFr Bld: 5.5 % of total Hgb (ref <5.7)
Mean Plasma Glucose: 111 mg/dL
eAG (mmol/L): 6.2 mmol/L

## 2020-09-23 LAB — VITAMIN D 25 HYDROXY (VIT D DEFICIENCY, FRACTURES): Vit D, 25-Hydroxy: 32 ng/mL (ref 30–100)

## 2020-09-23 LAB — LIPID PANEL
Cholesterol: 286 mg/dL — ABNORMAL HIGH (ref <200)
HDL: 61 mg/dL (ref >=40)
LDL Cholesterol (Calc): 200 mg/dL (calc) — ABNORMAL HIGH
Non-HDL Cholesterol (Calc): 225 mg/dL (calc) — ABNORMAL HIGH (ref <130)
Total CHOL/HDL Ratio: 4.7 (calc) (ref <5.0)
Triglycerides: 117 mg/dL (ref <150)

## 2020-09-23 LAB — TESTOSTERONE: Testosterone: 238 ng/dL — ABNORMAL LOW (ref 250–827)

## 2020-09-23 LAB — TSH: TSH: 1.41 mIU/L (ref 0.40–4.50)

## 2020-09-23 LAB — VITAMIN B12: Vitamin B-12: 530 pg/mL (ref 200–1100)

## 2020-09-27 ENCOUNTER — Other Ambulatory Visit: Payer: Self-pay

## 2020-09-27 ENCOUNTER — Ambulatory Visit (INDEPENDENT_AMBULATORY_CARE_PROVIDER_SITE_OTHER): Payer: 59 | Admitting: Internal Medicine

## 2020-09-27 ENCOUNTER — Encounter: Payer: Self-pay | Admitting: Internal Medicine

## 2020-09-27 VITALS — BP 109/62 | HR 70 | Temp 97.8°F | Resp 17 | Ht 66.0 in | Wt 168.2 lb

## 2020-09-27 DIAGNOSIS — E7800 Pure hypercholesterolemia, unspecified: Secondary | ICD-10-CM

## 2020-09-27 DIAGNOSIS — E663 Overweight: Secondary | ICD-10-CM | POA: Insufficient documentation

## 2020-09-27 DIAGNOSIS — E785 Hyperlipidemia, unspecified: Secondary | ICD-10-CM | POA: Insufficient documentation

## 2020-09-27 DIAGNOSIS — E349 Endocrine disorder, unspecified: Secondary | ICD-10-CM | POA: Insufficient documentation

## 2020-09-27 DIAGNOSIS — Z6825 Body mass index (BMI) 25.0-25.9, adult: Secondary | ICD-10-CM | POA: Insufficient documentation

## 2020-09-27 NOTE — Progress Notes (Signed)
Subjective:    Patient ID: Jack Hicks, male    DOB: 01-Apr-1991, 30 y.o.   MRN: [id number]  HPI  Pt presents to the clinic today for follow up of labs. His recent labs showed elevated LDL of 200, and testosterone of 238. It was recommended that he start on cholesterol lowering medication in addition to low fat diet and increase in aerobic exercise. He had also been having symptoms of fatigue, sexual dysfunction. He reports familial history of HLD on his mother's side. He reports he had a cousin in his mid 47's that died from heart failure. He has already begun to eat clean. He has not started exercising.  Review of Systems  Past Medical History:  Diagnosis Date   Allergy    Heart murmur    as child   History of chicken pox    Thyroid disease    borderline Hypothyroidism     Current Outpatient Medications  Medication Sig Dispense Refill   cetirizine (ZYRTEC) 10 MG chewable tablet Chew 10 mg by mouth daily.     No current facility-administered medications for this visit.    No Known Allergies  Family History  Problem Relation Age of Onset   Hyperlipidemia Mother    Hypertension Mother    Hyperlipidemia Father    Hypertension Father    Hyperlipidemia Maternal Grandmother    Hypertension Maternal Grandmother    Prostate cancer Maternal Grandfather    Hyperlipidemia Maternal Grandfather    Stroke Maternal Grandfather    Hypertension Maternal Grandfather    Hyperlipidemia Paternal Grandmother    Hypertension Paternal Grandmother    Arthritis Paternal Grandfather    Hyperlipidemia Paternal Grandfather    Hypertension Paternal Grandfather     Social History   Socioeconomic History   Marital status: Married    Spouse name: Not on file   Number of children: Not on file   Years of education: Not on file   Highest education level: Not on file  Occupational History   Not on file  Tobacco Use   Smoking status: Never   Smokeless tobacco: Never  Vaping Use   Vaping  Use: Never used  Substance and Sexual Activity   Alcohol use: Yes    Alcohol/week: 1.0 standard drink    Types: 1 Standard drinks or equivalent per week   Drug use: No   Sexual activity: Yes  Other Topics Concern   Not on file  Social History Narrative   Not on file   Social Determinants of Health   Financial Resource Strain: Not on file  Food Insecurity: Not on file  Transportation Needs: Not on file  Physical Activity: Not on file  Stress: Not on file  Social Connections: Not on file  Intimate Partner Violence: Not on file     Constitutional: Pt reports fatigue. Denies fever, malaise, headache or abrupt weight changes.  Respiratory: Denies difficulty breathing, shortness of breath, cough or sputum production.   Cardiovascular: Denies chest pain, chest tightness, palpitations or swelling in the hands or feet.  GU: Pt reports sexual dysfunction. Denies urgency, frequency, pain with urination, burning sensation, blood in urine, odor or discharge. Neurological: Denies dizziness, difficulty with memory, difficulty with speech or problems with balance and coordination.  Psych: Pt has a history of anxiety. Denies depression, SI/HI.  No other specific complaints in a complete review of systems (except as listed in HPI above).     BP 109/62 (BP Location: Right Arm, Patient Position: Sitting, Cuff Size:  Normal)   Pulse 70   Temp 97.8 F (36.6 C) (Temporal)   Resp 17   Ht 5\' 6"  (1.676 m)   Wt 168 lb 3.2 oz (76.3 kg)   SpO2 99%   BMI 27.15 kg/m   Wt Readings from Last 3 Encounters:  09/21/20 170 lb 12.8 oz (77.5 kg)  12/16/19 160 lb (72.6 kg)  07/03/19 160 lb (72.6 kg)    General: Appears his stated age, overweight,  in NAD. Cardiovascular: Normal rate. Pulmonary/Chest: Normal effort. Neurological: Alert and oriented.  Psychiatric: Mood and affect normal. Behavior is normal. Judgment and thought content normal.     BMET    Component Value Date/Time   NA 139  09/22/2020 0909   K 4.4 09/22/2020 0909   CL 102 09/22/2020 0909   CO2 30 09/22/2020 0909   GLUCOSE 88 09/22/2020 0909   BUN 16 09/22/2020 0909   CREATININE 0.82 09/22/2020 0909   CALCIUM 9.9 09/22/2020 0909   GFRNONAA >60 04/25/2019 0817   GFRAA >60 04/25/2019 0817    Lipid Panel     Component Value Date/Time   CHOL 286 (H) 09/22/2020 0909   TRIG 117 09/22/2020 0909   HDL 61 09/22/2020 0909   CHOLHDL 4.7 09/22/2020 0909   VLDL 19.4 07/03/2019 1204   LDLCALC 200 (H) 09/22/2020 0909    CBC    Component Value Date/Time   WBC 4.7 09/22/2020 0909   RBC 5.29 09/22/2020 0909   HGB 15.5 09/22/2020 0909   HCT 46.8 09/22/2020 0909   PLT 314 09/22/2020 0909   MCV 88.5 09/22/2020 0909   MCH 29.3 09/22/2020 0909   MCHC 33.1 09/22/2020 0909   RDW 12.9 09/22/2020 0909    Hgb A1C Lab Results  Component Value Date   HGBA1C 5.5 09/22/2020           Assessment & Plan:     11/22/2020, NP This visit occurred during the SARS-CoV-2 public health emergency.  Safety protocols were in place, including screening questions prior to the visit, additional usage of staff PPE, and extensive cleaning of exam room while observing appropriate contact time as indicated for disinfecting solutions.

## 2020-09-27 NOTE — Assessment & Plan Note (Signed)
He actively working on low fat diet and exercise for weight loss Will hold off on statin therapy at this time and plan to repeat lipid in 3 months, lab only Discussed role of familial hyperlipidemia, and if this is the case, he will likely need statin therapy no matter what

## 2020-09-27 NOTE — Assessment & Plan Note (Signed)
Actively working on diet and exercise for weight loss

## 2020-09-27 NOTE — Patient Instructions (Signed)
Heart-Healthy Eating Plan Heart-healthy meal planning includes: Eating less unhealthy fats. Eating more healthy fats. Making other changes in your diet. Talk with your doctor or a diet specialist (dietitian) to create an eating plan that is right for you. What is my plan? Your doctor may recommend an eating plan that includes: Total fat: ______% or less of total calories a day. Saturated fat: ______% or less of total calories a day. Cholesterol: less than _________mg a day. What are tips for following this plan? Cooking Avoid frying your food. Try to bake, boil, grill, or broil it instead. You can also reduce fat by: Removing the skin from poultry. Removing all visible fats from meats. Steaming vegetables in water or broth. Meal planning  At meals, divide your plate into four equal parts: Fill one-half of your plate with vegetables and green salads. Fill one-fourth of your plate with whole grains. Fill one-fourth of your plate with lean protein foods. Eat 4-5 servings of vegetables per day. A serving of vegetables is: 1 cup of raw or cooked vegetables. 2 cups of raw leafy greens. Eat 4-5 servings of fruit per day. A serving of fruit is: 1 medium whole fruit.  cup of dried fruit.  cup of fresh, frozen, or canned fruit.  cup of 100% fruit juice. Eat more foods that have soluble fiber. These are apples, broccoli, carrots, beans, peas, and barley. Try to get 20-30 g of fiber per day. Eat 4-5 servings of nuts, legumes, and seeds per week: 1 serving of dried beans or legumes equals  cup after being cooked. 1 serving of nuts is  cup. 1 serving of seeds equals 1 tablespoon.  General information Eat more home-cooked food. Eat less restaurant, buffet, and fast food. Limit or avoid alcohol. Limit foods that are high in starch and sugar. Avoid fried foods. Lose weight if you are overweight. Keep track of how much salt (sodium) you eat. This is important if you have high blood  pressure. Ask your doctor to tell you more about this. Try to add vegetarian meals each week. Fats Choose healthy fats. These include olive oil and canola oil, flaxseeds, walnuts, almonds, and seeds. Eat more omega-3 fats. These include salmon, mackerel, sardines, tuna, flaxseed oil, and ground flaxseeds. Try to eat fish at least 2 times each week. Check food labels. Avoid foods with trans fats or high amounts of saturated fat. Limit saturated fats. These are often found in animal products, such as meats, butter, and cream. These are also found in plant foods, such as palm oil, palm kernel oil, and coconut oil. Avoid foods with partially hydrogenated oils in them. These have trans fats. Examples are stick margarine, some tub margarines, cookies, crackers, and other baked goods. What foods can I eat? Fruits All fresh, canned (in natural juice), or frozen fruits. Vegetables Fresh or frozen vegetables (raw, steamed, roasted, or grilled). Green salads. Grains Most grains. Choose whole wheat and whole grains most of the time. Rice andpasta, including brown rice and pastas made with whole wheat. Meats and other proteins Lean, well-trimmed beef, veal, pork, and lamb. Chicken and turkey without skin. All fish and shellfish. Wild duck, rabbit, pheasant, and venison. Egg whites or low-cholesterol egg substitutes. Dried beans, peas, lentils, and tofu. Seedsand most nuts. Dairy Low-fat or nonfat cheeses, including ricotta and mozzarella. Skim or 1% milk that is liquid, powdered, or evaporated. Buttermilk that is made with low-fatmilk. Nonfat or low-fat yogurt. Fats and oils Non-hydrogenated (trans-free) margarines. Vegetable oils, including soybean, sesame,   sunflower, olive, peanut, safflower, corn, canola, and cottonseed. Salad dressings or mayonnaisemade with a vegetable oil. Beverages Mineral water. Coffee and tea. Diet carbonated beverages. Sweets and desserts Sherbet, gelatin, and fruit ice. Small  amounts of dark chocolate. Limit all sweets and desserts. Seasonings and condiments All seasonings and condiments. The items listed above may not be a complete list of foods and drinks you can eat. Contact a dietitian for more options. What foods should I avoid? Fruits Canned fruit in heavy syrup. Fruit in cream or butter sauce. Fried fruit. Limitcoconut. Vegetables Vegetables cooked in cheese, cream, or butter sauce. Fried vegetables. Grains Breads that are made with saturated or trans fats, oils, or whole milk. Croissants. Sweet rolls. Donuts. High-fat crackers,such as cheese crackers. Meats and other proteins Fatty meats, such as hot dogs, ribs, sausage, bacon, rib-eye roast or steak. High-fat deli meats, such as salami and bologna. Caviar. Domestic duck andgoose. Organ meats, such as liver. Dairy Cream, sour cream, cream cheese, and creamed cottage cheese. Whole-milk cheeses. Whole or 2% milk that is liquid, evaporated, or condensed. Whole buttermilk. Cream sauce or high-fat cheese sauce. Yogurt that is made fromwhole milk. Fats and oils Meat fat, or shortening. Cocoa butter, hydrogenated oils, palm oil, coconut oil, palm kernel oil. Solid fats and shortenings, including bacon fat, salt pork, lard, and butter. Nondairy cream substitutes. Salad dressings with cheeseor sour cream. Beverages Regular sodas and juice drinks with added sugar. Sweets and desserts Frosting. Pudding. Cookies. Cakes. Pies. Milk chocolate or white chocolate.Buttered syrups. Full-fat ice cream or ice cream drinks. The items listed above may not be a complete list of foods and drinks to avoid. Contact a dietitian for more information. Summary Heart-healthy meal planning includes eating less unhealthy fats, eating more healthy fats, and making other changes in your diet. Eat a balanced diet. This includes fruits and vegetables, low-fat or nonfat dairy, lean protein, nuts and legumes, whole grains, and heart-healthy  oils and fats. This information is not intended to replace advice given to you by your health care provider. Make sure you discuss any questions you have with your healthcare provider. Document Revised: 06/07/2017 Document Reviewed: 05/11/2017 Elsevier Patient Education  2022 [employer]  

## 2020-09-27 NOTE — Assessment & Plan Note (Signed)
Discussed his current testosterone level Advised him the only way to boost testosterone is with exogenous testosterone or blocking estrogen Discussed risk vs benefits of testosterone therapy Will plan to repeat this in 3 months Consider referral to urology for treatment of low testosterone

## 2020-10-23 ENCOUNTER — Other Ambulatory Visit: Payer: Self-pay | Admitting: Internal Medicine

## 2020-10-23 NOTE — Telephone Encounter (Signed)
Refusing refill request for hydroxyzine. It is not on the patient's current MAR.

## 2020-10-26 ENCOUNTER — Encounter: Payer: Self-pay | Admitting: Internal Medicine

## 2020-10-26 MED ORDER — HYDROXYZINE HCL 10 MG/5ML PO SYRP: 25.0000 mg | ORAL_SOLUTION | Freq: Three times a day (TID) | ORAL | 0 refills | Status: DC | PRN

## 2020-12-16 ENCOUNTER — Encounter: Payer: Self-pay | Admitting: Internal Medicine

## 2020-12-27 ENCOUNTER — Other Ambulatory Visit: Payer: Self-pay

## 2020-12-27 DIAGNOSIS — E7800 Pure hypercholesterolemia, unspecified: Secondary | ICD-10-CM

## 2020-12-27 DIAGNOSIS — E349 Endocrine disorder, unspecified: Secondary | ICD-10-CM

## 2020-12-28 ENCOUNTER — Other Ambulatory Visit: Payer: 59

## 2020-12-29 ENCOUNTER — Encounter: Payer: Self-pay | Admitting: Internal Medicine

## 2020-12-29 DIAGNOSIS — R7989 Other specified abnormal findings of blood chemistry: Secondary | ICD-10-CM

## 2020-12-30 ENCOUNTER — Other Ambulatory Visit: Payer: Self-pay

## 2020-12-31 LAB — TESTOSTERONE: Testosterone: 188 ng/dL — ABNORMAL LOW (ref 250–827)

## 2020-12-31 LAB — LIPID PANEL
Cholesterol: 208 mg/dL — ABNORMAL HIGH (ref <200)
HDL: 55 mg/dL (ref >=40)
LDL Cholesterol (Calc): 134 mg/dL (calc) — ABNORMAL HIGH
Non-HDL Cholesterol (Calc): 153 mg/dL (calc) — ABNORMAL HIGH (ref <130)
Total CHOL/HDL Ratio: 3.8 (calc) (ref <5.0)
Triglycerides: 86 mg/dL (ref <150)

## 2020-12-31 LAB — ESTROGENS, TOTAL: Estrogen: 77.7 pg/mL (ref 60–190)

## 2021-01-05 NOTE — Addendum Note (Signed)
Addended by: Lorre Munroe on: 01/05/2021 05:29 AM   Modules accepted: Orders

## 2021-04-20 ENCOUNTER — Encounter: Payer: Self-pay | Admitting: Internal Medicine

## 2021-04-20 ENCOUNTER — Other Ambulatory Visit: Payer: Self-pay

## 2021-04-20 ENCOUNTER — Ambulatory Visit (INDEPENDENT_AMBULATORY_CARE_PROVIDER_SITE_OTHER): Payer: Self-pay | Admitting: Internal Medicine

## 2021-04-20 VITALS — BP 109/67 | HR 75 | Temp 97.5°F | Ht 66.0 in | Wt 156.6 lb

## 2021-04-20 DIAGNOSIS — J00 Acute nasopharyngitis [common cold]: Secondary | ICD-10-CM

## 2021-04-20 MED ORDER — AMOXICILLIN-POT CLAVULANATE 875-125 MG PO TABS: 1.0000 | ORAL_TABLET | Freq: Two times a day (BID) | ORAL | 0 refills | Status: DC

## 2021-04-20 MED ORDER — AMOXICILLIN 400 MG/5ML PO SUSR: 500.0000 mg | Freq: Three times a day (TID) | ORAL | 0 refills | Status: AC

## 2021-04-20 NOTE — Progress Notes (Signed)
HPI  Pt presents to the clinic today with c/o nasal congestion, cough and chest congestion.  He reports this started 1.5 weeks ago.  He is blowing yellow/green mucus out of his nose.  The cough is dry and nonproductive.  He denies headache, runny nose, ear pain, sore throat, shortness of breath, chest pain, nausea, vomiting or diarrhea.  He denies fever, chills or body aches.  He has tried.  He has had a negative COVID test at home.  He has had sick contacts with similar symptoms.  Review of Systems      Past Medical History:  Diagnosis Date   Allergy    Heart murmur    as child   History of chicken pox    Thyroid disease    borderline Hypothyroidism     Family History  Problem Relation Age of Onset   Hyperlipidemia Mother    Hypertension Mother    Hyperlipidemia Father    Hypertension Father    Hyperlipidemia Maternal Grandmother    Hypertension Maternal Grandmother    Prostate cancer Maternal Grandfather    Hyperlipidemia Maternal Grandfather    Stroke Maternal Grandfather    Hypertension Maternal Grandfather    Hyperlipidemia Paternal Grandmother    Hypertension Paternal Grandmother    Arthritis Paternal Grandfather    Hyperlipidemia Paternal Grandfather    Hypertension Paternal Grandfather     Social History   Socioeconomic History   Marital status: Married    Spouse name: Not on file   Number of children: Not on file   Years of education: Not on file   Highest education level: Not on file  Occupational History   Not on file  Tobacco Use   Smoking status: Never   Smokeless tobacco: Never  Vaping Use   Vaping Use: Never used  Substance and Sexual Activity   Alcohol use: Yes    Alcohol/week: 1.0 standard drink    Types: 1 Standard drinks or equivalent per week   Drug use: No   Sexual activity: Yes  Other Topics Concern   Not on file  Social History Narrative   Not on file   Social Determinants of Health   Financial Resource Strain: Not on file  Food  Insecurity: Not on file  Transportation Needs: Not on file  Physical Activity: Not on file  Stress: Not on file  Social Connections: Not on file  Intimate Partner Violence: Not on file    No Known Allergies   Constitutional: Positive. Denies headache, fatigue, fever or abrupt weight changes.  HEENT:  Positive nasal congestion. Denies eye redness, eye pain, pressure behind the eyes, facial pain, ear pain, ringing in the ears, wax buildup, runny nose or sore throat. Respiratory: Positive cough. Denies difficulty breathing or shortness of breath.  Cardiovascular: Denies chest pain, chest tightness, palpitations or swelling in the hands or feet.   No other specific complaints in a complete review of systems (except as listed in HPI above).  Objective:  BP 109/67 (BP Location: Right Arm, Patient Position: Sitting, Cuff Size: Normal)    Pulse 75    Temp (!) 97.5 F (36.4 C) (Temporal)    Ht 5\' 6"  (1.676 m)    Wt 156 lb 9.6 oz (71 kg)    SpO2 98%    BMI 25.28 kg/m   Wt Readings from Last 3 Encounters:  09/27/20 168 lb 3.2 oz (76.3 kg)  09/21/20 170 lb 12.8 oz (77.5 kg)  12/16/19 160 lb (72.6 kg)  General: Appears his stated age, well developed, well nourished in NAD. HEENT: Head: normal shape and size, no sinus tenderness noted; Eyes: sclera white, no icterus, conjunctiva pink;  Neck: No cervical lymphadenopathy.  Cardiovascular: Normal rate and rhythm. S1,S2 noted.  No murmur, rubs or gallops noted.  Pulmonary/Chest: Normal effort and positive vesicular breath sounds. No respiratory distress. No wheezes, rales or ronchi noted.       Assessment & Plan:   Upper Respiratory Infection:  Get some rest and drink plenty of water eRx for Amoxicillin 500 mg 3 times daily for 10 days Could benefit from Flonase OTC as needed   RTC as needed or if symptoms persist.   Webb Silversmith, NP This visit occurred during the SARS-CoV-2 public health emergency.  Safety protocols were in place,  including screening questions prior to the visit, additional usage of staff PPE, and extensive cleaning of exam room while observing appropriate contact time as indicated for disinfecting solutions.

## 2021-04-20 NOTE — Patient Instructions (Signed)

## 2021-04-22 IMAGING — CT CT RENAL STONE PROTOCOL
3 of 4 series · 6 of 46 positions shown, 11 images · non-contrast
Comparison: None.

CLINICAL DATA: Left flank pain

EXAM:
CT ABDOMEN AND PELVIS WITHOUT CONTRAST
TECHNIQUE: Multidetector CT imaging of the abdomen and pelvis was performed
following the standard protocol without oral or 6IV contrast.

[Series 4: lung bases · axial · 0.69mm/px · z∈[-85,-65]mm · 2 of 14 slices shown, 5 images]
[im 5/14  soft-tissue]
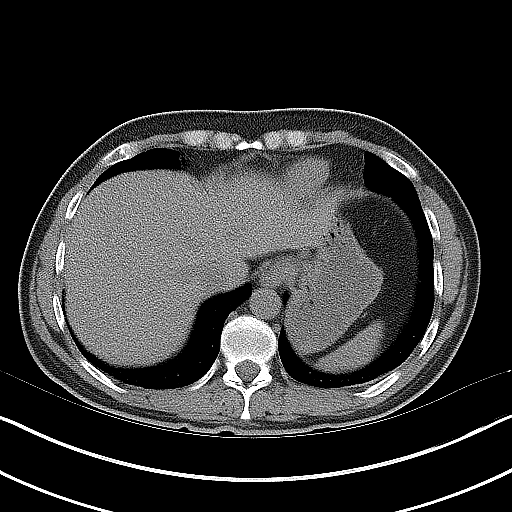
[im 5/14  lung]
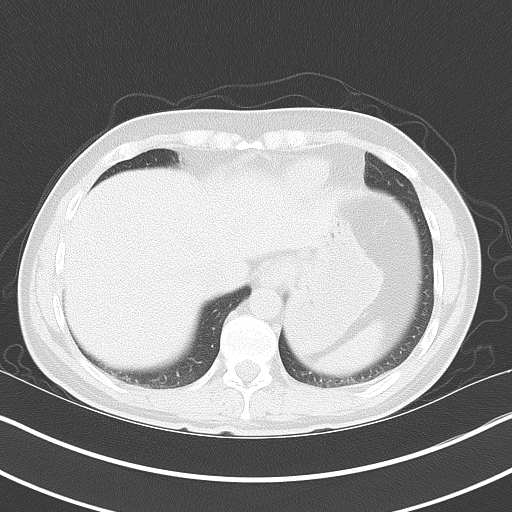
[im 5/14  bone]
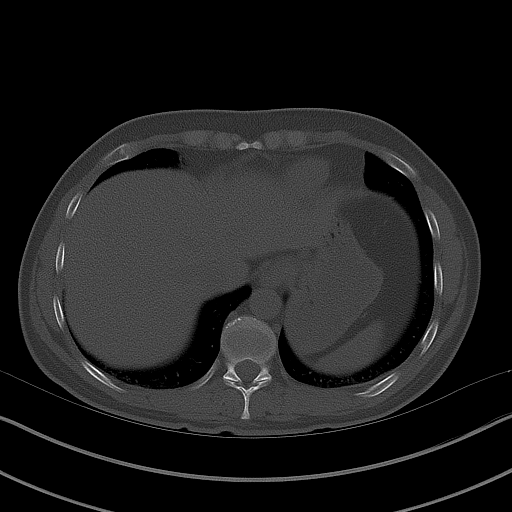
[im 9/14  soft-tissue]
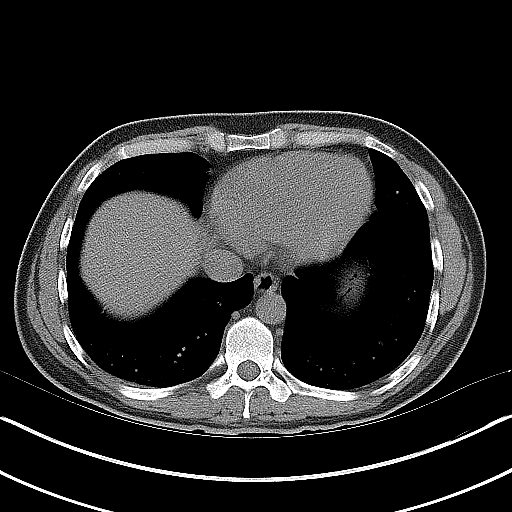
[im 9/14  lung]
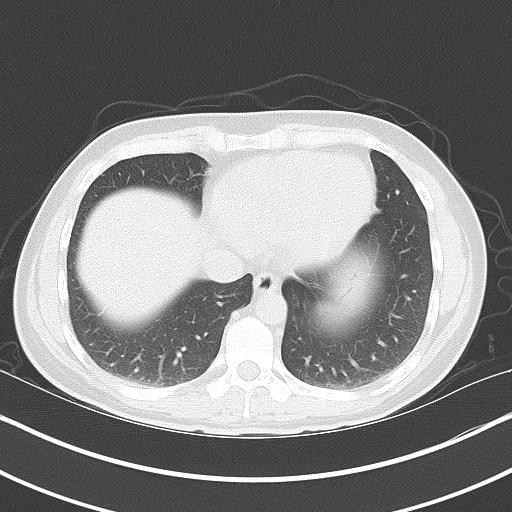

[Series 5: coronal · coronal · 0.74mm/px · 3 of 132 slices shown, 4 images]
[im 44/132  soft-tissue]
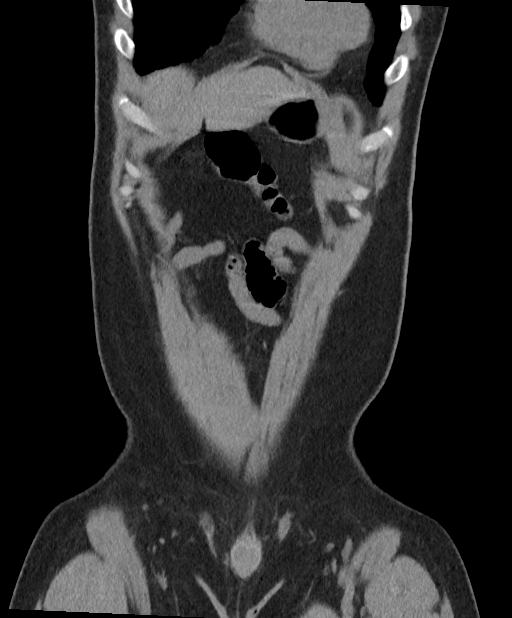
[im 59/132  soft-tissue]
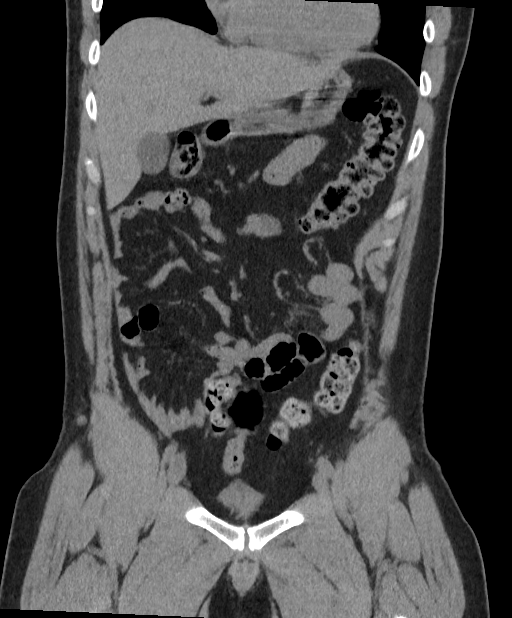
[im 59/132  bone]
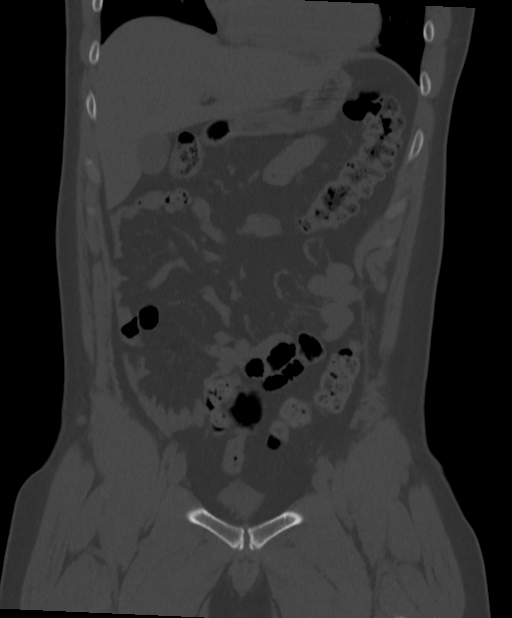
[im 73/132  soft-tissue]
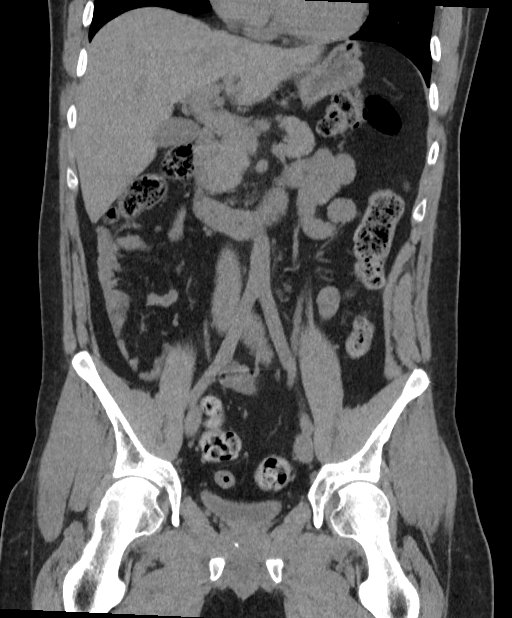

[Series 6: sagittal · sagittal · 0.53mm/px · 1 of 168 slices shown, 2 images]
[im 56/168  soft-tissue]
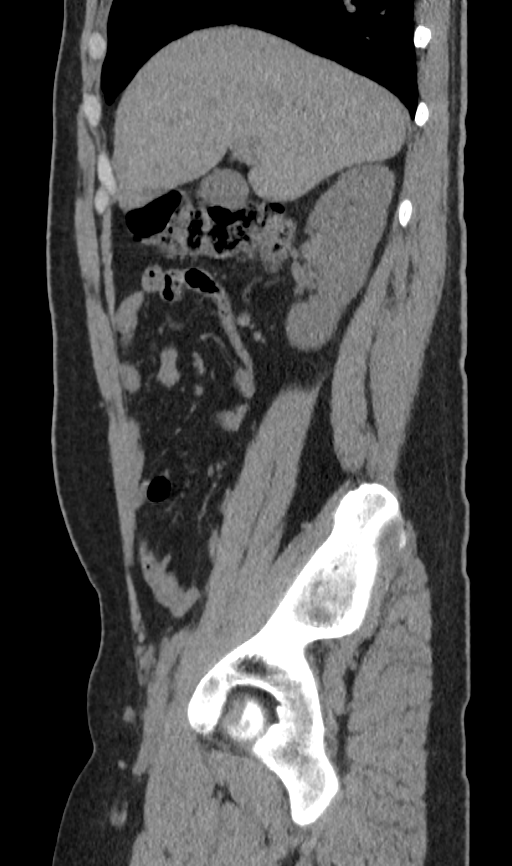
[im 56/168  bone]
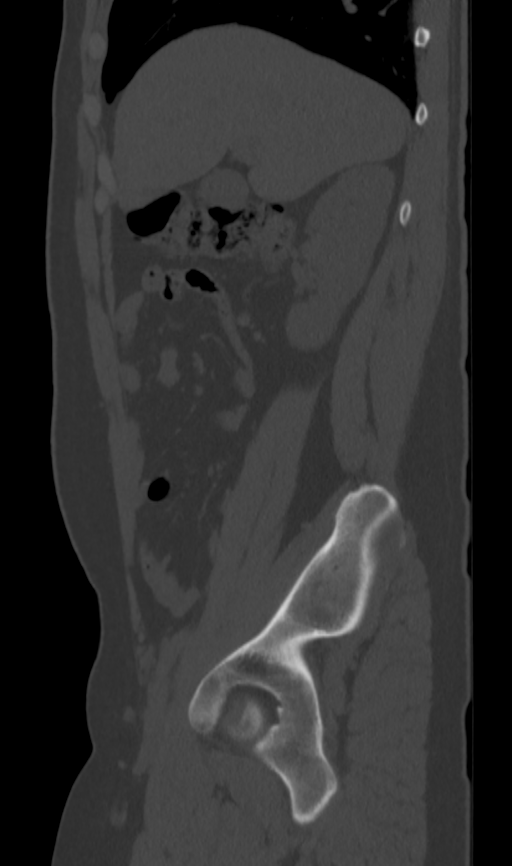

[6 of 46 positions shown; findings below may reference images not displayed]

FINDINGS: Lower chest: Lung bases are clear.

Hepatobiliary: No focal liver lesions are evident on this
noncontrast enhanced study. The gallbladder wall is not appreciably
thickened. There is no biliary duct dilatation.

Pancreas: There is no pancreatic mass or inflammatory focus.

Spleen: No splenic lesions are evident.

Adrenals/Urinary Tract: Adrenals bilaterally appear normal. There is
a 6 x 5 mm angiomyolipoma in the medial upper pole of the right
kidney. Evidence of fetal lobulation on each side, an anatomic
variant. Left kidney is subtly edematous. There is mild
hydronephrosis on the left. There is no appreciable hydronephrosis
on the right. There is a 1 mm calculus in the upper pole of each
kidney. There is a 3 x 2 mm calculus at the left ureterovesical
junction. No other ureteral calculi are evident. Urinary bladder is
midline with wall thickness within normal limits.

Stomach/Bowel: There is moderate stool throughout the colon. There
is no appreciable bowel wall or mesenteric thickening. Terminal
ileum appears normal. No evident bowel obstruction. No free air or
portal venous air.

Vascular/Lymphatic: There is no abdominal aortic aneurysm. No
vascular lesions are appreciable on this noncontrast enhanced study.
There is no evident adenopathy in the abdomen or pelvis.

Reproductive: There are multiple prostatic calculi. Prostate and
seminal vesicles are normal in size and contour. No pelvic masses
are evident.

Other: Appendix appears normal. No abscess or ascites is evident in
the abdomen or pelvis. There is slight fat in the umbilicus.

Musculoskeletal: No blastic or lytic bone lesions. No intramuscular
lesions are evident.
IMPRESSION: 1. 3 x 2 mm calculus at the left ureterovesical junction with mild
hydronephrosis on the left. Left kidney subtly edematous.

2. Small nonobstructing calculi in each kidney. Prostatic calculi
present.

3. No evident bowel obstruction. No abscess in the abdomen or
pelvis. Appendix appears normal.

## 2021-05-24 DIAGNOSIS — R7989 Other specified abnormal findings of blood chemistry: Secondary | ICD-10-CM | POA: Diagnosis not present

## 2021-05-24 DIAGNOSIS — E291 Testicular hypofunction: Secondary | ICD-10-CM | POA: Diagnosis not present

## 2021-07-22 ENCOUNTER — Encounter: Payer: Self-pay | Admitting: Internal Medicine

## 2021-08-07 ENCOUNTER — Emergency Department
Admission: EM | Admit: 2021-08-07 | Discharge: 2021-08-07 | Disposition: A | Discharge status: Home / Self Care | Payer: BC Managed Care – PPO | Attending: Emergency Medicine | Admitting: Emergency Medicine

## 2021-08-07 ENCOUNTER — Other Ambulatory Visit: Payer: Self-pay

## 2021-08-07 ENCOUNTER — Encounter: Payer: Self-pay | Admitting: Emergency Medicine

## 2021-08-07 DIAGNOSIS — T18128A Food in esophagus causing other injury, initial encounter: Secondary | ICD-10-CM | POA: Insufficient documentation

## 2021-08-07 DIAGNOSIS — T18108A Unspecified foreign body in esophagus causing other injury, initial encounter: Secondary | ICD-10-CM

## 2021-08-07 DIAGNOSIS — X58XXXA Exposure to other specified factors, initial encounter: Secondary | ICD-10-CM | POA: Diagnosis not present

## 2021-08-07 MED ORDER — ONDANSETRON HCL 4 MG/2ML IJ SOLN: 4.0000 mg | Freq: Once | INTRAMUSCULAR | Status: DC

## 2021-08-07 MED ORDER — GLUCAGON HCL RDNA (DIAGNOSTIC) 1 MG IJ SOLR: 1.0000 mg | Freq: Once | INTRAMUSCULAR | Status: DC

## 2021-08-07 MED ORDER — LORAZEPAM 2 MG/ML IJ SOLN: 1.0000 mg | Freq: Once | INTRAMUSCULAR | Status: DC

## 2021-08-07 NOTE — ED Provider Notes (Signed)
? ?[facility] ?Provider Note ? ? ? Event Date/Time  ? First MD Initiated Contact with Patient 08/07/21 1411   ?  (approximate) ? ? ?History  ? ?Chief Complaint ?Foreign Body ? ? ?HPI ?Jack Hicks is a 31 y.o. male, history of anxiety, hyperlipidemia, presents to the emergency department for evaluation of esophageal foreign body.  Patient states that he was eating Brussels sprouts and chicken when he felt the food get caught in his throat.  Denies any shortness of breath, though feels like it is lodged deep in his throat just above his sternum.  He states that this happens to him frequently; he reportedly has a history of esophageal strictures.  Denies fever/chills, chest pain, shortness of breath, abdominal pain, flank pain, diarrhea, urinary symptoms, or dizziness/lightheadedness. ? ?History Limitations: No limitations. ? ?    ? ? ?Physical Exam  ?Triage Vital Signs: ?ED Triage Vitals  ?Enc Vitals Group  ?   BP 08/07/21 1359 (!) 151/78  ?   Pulse Rate 08/07/21 1359 (!) 101  ?   Resp 08/07/21 1359 19  ?   Temp 08/07/21 1359 98 ?F (36.7 ?C)  ?   Temp src --   ?   SpO2 08/07/21 1359 100 %  ?   Weight 08/07/21 1402 156 lb 8.4 oz (71 kg)  ?   Height 08/07/21 1402 5\' 6"  (1.676 m)  ?   Head Circumference --   ?   Peak Flow --   ?   Pain Score 08/07/21 1359 0  ?   Pain Loc --   ?   Pain Edu? --   ?   Excl. in GC? --   ? ? ?Most recent vital signs: ?Vitals:  ? 08/07/21 1359 08/07/21 1400  ?BP: (!) 151/78 (!) 151/78  ?Pulse: (!) 101 97  ?Resp: 19 18  ?Temp: 98 ?F (36.7 ?C) 98 ?F (36.7 ?C)  ?SpO2: 100% 98%  ? ? ?General: Awake, NAD.  Speaking in full sentences.  He is spitting up intermittently, though no active drooling. ?Skin: Warm, dry. No rashes or lesions.  ?Eyes: PERRL. Conjunctivae normal.  ?CV: Good peripheral perfusion.  ?Resp: Normal effort.  Lung sounds clear bilaterally in the apices and bases.  No stridor present. ?Abd: Soft, non-tender. No distention.  ?Neuro: At baseline. No gross  neurological deficits.  ? ?Focused Exam: Not applicable. ? ?Physical Exam ? ? ? ?ED Results / Procedures / Treatments  ?Labs ?(all labs ordered are listed, but only abnormal results are displayed) ?Labs Reviewed - No data to display ? ? ?EKG ?Not applicable. ? ? ?RADIOLOGY ? ?ED Provider Interpretation: Not applicable ? ?No results found. ? ?PROCEDURES: ? ?Critical Care performed: Not applicable ? ?Procedures ? ? ? ?MEDICATIONS ORDERED IN ED: ?Medications - No data to display ? ? ? ?IMPRESSION / MDM / ASSESSMENT AND PLAN / ED COURSE  ?I reviewed the triage vital signs and the nursing notes. ?             ?               ? ?Differential diagnosis includes, but is not limited to, esophageal foreign body, airway foreign body, esophageal perforation. ? ?ED Course ?Patient appears well, vitals are within normal limits for the patient, NAD.  Provide patient with soda and advised him to take small sips and attempt to clear the obstruction. ? ?Upon reexamination, patient states that he feels that he is cleared the esophageal foreign  body successfully.  He is currently endorsing no pain or discomfort at this time. ? ?Assessment/Plan ?Presentation consistent with esophageal foreign body, low suspicion for any serious or life-threatening secondary complications.  He appears to have a past that successfully with conservative measures.  Patient does endorse a history of esophageal strictures, though states that he is not actively being seen by gastroenterologist.  We will provide him a referral.  Additionally, advised him to take 40 mg omeprazole daily.  Patient states that he will pick up this medication over-the-counter.  We will plan to discharge. ? ?Provided the patient with anticipatory guidance, return precautions, and educational material. Encouraged the patient to return to the emergency department at any time if they begin to experience any new or worsening symptoms. Patient expressed understanding and agreed with the  plan.  ? ?  ? ? ?FINAL CLINICAL IMPRESSION(S) / ED DIAGNOSES  ? ?Final diagnoses:  ?Foreign body in esophagus, initial encounter  ? ? ? ?Rx / DC Orders  ? ?ED Discharge Orders   ? ? None  ? ?  ? ? ? ?Note:  This document was prepared using Dragon voice recognition software and may include unintentional dictation errors. ?  ?Naitik, [place], [place] ?08/07/21 1458 ? ?  ?Shaune Pollack, MD ?08/07/21 1607 ? ?

## 2021-08-07 NOTE — Discharge Instructions (Addendum)
-  Please take 40 mg omeprazole daily.  ?-Follow-up with the gastroenterologist listed above, as discussed. ?-Return to the emergency department anytime if you begin to experience any new or worsening symptoms. ?

## 2021-08-07 NOTE — ED Triage Notes (Signed)
Pt reports was eating brussel sprouts and one got caught in his throat. Pt reports he has been coughing some of it up but cannot get it all up and feels like it is lodged in his throat. Pt able to speak but has hiccups, no drooling noted.  ?

## 2021-08-08 ENCOUNTER — Telehealth: Payer: Self-pay

## 2021-08-08 NOTE — Telephone Encounter (Signed)
Patient states he has had a long history of dysphagia and would like to talk to you first before scheduling a appointment. Made appointment for 08/18/21 ?

## 2021-08-18 ENCOUNTER — Ambulatory Visit: Payer: BC Managed Care – PPO | Admitting: Gastroenterology

## 2021-09-14 ENCOUNTER — Ambulatory Visit: Payer: BC Managed Care – PPO | Admitting: Gastroenterology

## 2021-09-14 ENCOUNTER — Encounter: Payer: Self-pay | Admitting: Gastroenterology

## 2021-09-14 ENCOUNTER — Other Ambulatory Visit: Payer: Self-pay

## 2021-09-14 VITALS — BP 115/80 | HR 75 | Temp 98.2°F | Ht 66.0 in | Wt 171.1 lb

## 2021-09-14 DIAGNOSIS — R1319 Other dysphagia: Secondary | ICD-10-CM | POA: Diagnosis not present

## 2021-09-14 DIAGNOSIS — R1310 Dysphagia, unspecified: Secondary | ICD-10-CM

## 2021-09-14 NOTE — Progress Notes (Signed)
Jack Repress, MD [address]  [address]  [place], [place] [address]  Main: [phone]  Fax: [phone]    Gastroenterology Consultation  Referring Provider:     Lorre Munroe, NP Primary Care Physician:  Lorre Munroe, NP Primary Gastroenterologist:  Dr. Arlyss Hicks Reason for Consultation:     Dysphagia        HPI:   Jack Hicks is a 31 y.o. male referred by Dr. Lorre Munroe, NP  for consultation & management of dysphagia.  Patient reports difficulty swallowing to solids since age of 32.  He has been experiencing intermittent episodes of food getting stuck in his throat, to various solid foods such as vegetables, meat, rice.  He was recently in the ER about a month ago, the food bolus spontaneously passed after drinking Coke.  Patient denies any heartburn. He does not take any medications currently.  Patient does not smoke or drink alcohol.  He denies any other GI symptoms.  He does report severe seasonal allergies, takes Zyrtec and clomiphene regularly. Patient denies any food intolerances/allergies. History of TMJ NSAIDs: None  Antiplts/Anticoagulants/Anti thrombotics: None  GI Procedures: None  Past Medical History:  Diagnosis Date   Allergy    Heart murmur    as child   History of chicken pox    Thyroid disease    borderline Hypothyroidism     Past Surgical History:  Procedure Laterality Date   TISSUE GRAFT Left 2017    Current Outpatient Medications:    clomiPHENE (CLOMID) 50 MG tablet, Take 25 mg by mouth daily., Disp: , Rfl:    Family History  Problem Relation Age of Onset   Hyperlipidemia Mother    Hypertension Mother    Hyperlipidemia Father    Hypertension Father    Hyperlipidemia Maternal Grandmother    Hypertension Maternal Grandmother    Prostate cancer Maternal Grandfather    Hyperlipidemia Maternal Grandfather    Stroke Maternal Grandfather    Hypertension Maternal Grandfather    Hyperlipidemia Paternal  Grandmother    Hypertension Paternal Grandmother    Arthritis Paternal Grandfather    Hyperlipidemia Paternal Grandfather    Hypertension Paternal Grandfather      Social History   Tobacco Use   Smoking status: Never   Smokeless tobacco: Never  Vaping Use   Vaping Use: Never used  Substance Use Topics   Alcohol use: Yes    Alcohol/week: 1.0 standard drink    Types: 1 Standard drinks or equivalent per week   Drug use: No    Allergies as of 09/14/2021   (No Known Allergies)    Review of Systems:    All systems reviewed and negative except where noted in HPI.   Physical Exam:  BP 115/80 (BP Location: Left Arm, Patient Position: Sitting, Cuff Size: Normal)   Pulse 75   Temp 98.2 F (36.8 C) (Oral)   Ht 5\' 6"  (1.676 m)   Wt 171 lb 2 oz (77.6 kg)   BMI 27.62 kg/m  No LMP for male patient.  General:   Alert,  Well-developed, well-nourished, pleasant and cooperative in NAD Head:  Normocephalic and atraumatic. Eyes:  Sclera clear, no icterus.   Conjunctiva pink. Ears:  Normal auditory acuity. Nose:  No deformity, discharge, or lesions. Mouth:  No deformity or lesions,oropharynx pink & moist. Neck:  Supple; no masses or thyromegaly. Lungs:  Respirations even and unlabored.  Clear throughout to auscultation.   No wheezes, crackles, or  rhonchi. No acute distress. Heart:  Regular rate and rhythm; no murmurs, clicks, rubs, or gallops. Abdomen:  Normal bowel sounds. Soft, non-tender and non-distended without masses, hepatosplenomegaly or hernias noted.  No guarding or rebound tenderness.   Rectal: Not performed Msk:  Symmetrical without gross deformities. Good, equal movement & strength bilaterally. Pulses:  Normal pulses noted. Extremities:  No clubbing or edema.  No cyanosis. Neurologic:  Alert and oriented x3;  grossly normal neurologically. Skin:  Intact without significant lesions or rashes. No jaundice. Psych:  Alert and cooperative. Normal mood and affect.  Imaging  Studies: X-ray esophagus 06/16/2010 IMPRESSION:   1.  Normal esophageal motility.  2.  Small sliding-type hiatal hernia.  3.  Lower esophageal mucosal ring.    Assessment and Plan:   Jack Hicks is a 31 y.o. pleasant Caucasian male with chronic symptoms of dysphagia to solids, episodes of food impaction that spontaneously passed  Recommend EGD with proximal and distal esophageal biopsies to rule out EOE Rule out any other structural causes  I have discussed alternative options, risks & benefits,  which include, but are not limited to, bleeding, infection, perforation,respiratory complication & drug reaction.  The patient agrees with this plan & written consent will be obtained.    Follow up based on the EGD findings   Jack Repress, MD

## 2021-09-15 ENCOUNTER — Telehealth: Payer: Self-pay

## 2021-09-15 MED ORDER — OMEPRAZOLE 40 MG PO CPDR: 40.0000 mg | DELAYED_RELEASE_CAPSULE | Freq: Two times a day (BID) | ORAL | 1 refills | Status: DC

## 2021-09-15 NOTE — Telephone Encounter (Signed)
Patient verbalized understanding sent medication to the pharmacy

## 2021-09-15 NOTE — Telephone Encounter (Signed)
Patient called to let us know he cancel his EGD with [place] that was schedule for 09/16/2021. He states he got the estimate of how much the procedure was going to cost and it was 2000 dollars. He said he is not going to have to pay that much for the procedure. He said that you said that he had EOE most likely and is wanting to know if you can prescribed a medication to help with this. Informed patient that she has to take biopsy to confirm that you have this and with out the biopsy we can not confirm it. Patient asked if there is anything else she can do form him besides the procedure because he will not have this done

## 2021-09-15 NOTE — Telephone Encounter (Signed)
Upper endoscopy is the only way to diagnose eosinophilic esophagitis because we have to take biopsies and look under microscope.  In the meantime, He could try Prilosec 40 mg p.o. twice daily 30 minutes before breakfast and before dinner for 4 to 8 weeks and see if patient notices any improvement in his symptoms.  He should let us know  RV

## 2021-09-15 NOTE — Addendum Note (Signed)
Addended by: Radene Knee L on: 09/15/2021 02:13 PM   Modules accepted: Orders

## 2021-09-16 ENCOUNTER — Ambulatory Visit
Admission: RE | Admit: 2021-09-16 | Payer: BC Managed Care – PPO | Source: Home / Self Care | Admitting: Gastroenterology

## 2021-09-16 ENCOUNTER — Encounter: Admission: RE | Payer: Self-pay | Source: Home / Self Care

## 2021-09-16 SURGERY — ESOPHAGOGASTRODUODENOSCOPY (EGD) WITH PROPOFOL
Anesthesia: General

## 2021-11-13 ENCOUNTER — Encounter: Payer: Self-pay | Admitting: Internal Medicine

## 2021-11-13 DIAGNOSIS — E349 Endocrine disorder, unspecified: Secondary | ICD-10-CM

## 2021-11-13 DIAGNOSIS — Z1159 Encounter for screening for other viral diseases: Secondary | ICD-10-CM

## 2021-11-13 DIAGNOSIS — Z0000 Encounter for general adult medical examination without abnormal findings: Secondary | ICD-10-CM

## 2021-11-14 NOTE — Telephone Encounter (Signed)
Please review.  KP

## 2021-11-15 ENCOUNTER — Other Ambulatory Visit: Payer: Self-pay

## 2021-11-15 DIAGNOSIS — E349 Endocrine disorder, unspecified: Secondary | ICD-10-CM

## 2021-11-15 DIAGNOSIS — Z0000 Encounter for general adult medical examination without abnormal findings: Secondary | ICD-10-CM

## 2021-11-15 DIAGNOSIS — Z1159 Encounter for screening for other viral diseases: Secondary | ICD-10-CM | POA: Diagnosis not present

## 2021-11-15 DIAGNOSIS — E7800 Pure hypercholesterolemia, unspecified: Secondary | ICD-10-CM | POA: Diagnosis not present

## 2021-11-16 DIAGNOSIS — E291 Testicular hypofunction: Secondary | ICD-10-CM | POA: Diagnosis not present

## 2021-11-16 DIAGNOSIS — R7989 Other specified abnormal findings of blood chemistry: Secondary | ICD-10-CM | POA: Diagnosis not present

## 2021-11-16 LAB — CBC
HCT: 48.6 % (ref 38.5–50.0)
Hemoglobin: 15.9 g/dL (ref 13.2–17.1)
MCH: 29 pg (ref 27.0–33.0)
MCHC: 32.7 g/dL (ref 32.0–36.0)
MCV: 88.7 fL (ref 80.0–100.0)
MPV: 10 fL (ref 7.5–12.5)
Platelets: 273 10*3/uL (ref 140–400)
RBC: 5.48 10*6/uL (ref 4.20–5.80)
RDW: 12.8 % (ref 11.0–15.0)
WBC: 6.5 10*3/uL (ref 3.8–10.8)

## 2021-11-16 LAB — COMPLETE METABOLIC PANEL WITH GFR
AG Ratio: 1.7 (calc) (ref 1.0–2.5)
ALT: 16 U/L (ref 9–46)
AST: 18 U/L (ref 10–40)
Albumin: 4.5 g/dL (ref 3.6–5.1)
Alkaline phosphatase (APISO): 84 U/L (ref 36–130)
BUN/Creatinine Ratio: 27 (calc) — ABNORMAL HIGH (ref 6–22)
BUN: 29 mg/dL — ABNORMAL HIGH (ref 7–25)
CO2: 28 mmol/L (ref 20–32)
Calcium: 9.5 mg/dL (ref 8.6–10.3)
Chloride: 100 mmol/L (ref 98–110)
Creat: 1.08 mg/dL (ref 0.60–1.26)
Globulin: 2.7 g/dL (calc) (ref 1.9–3.7)
Glucose, Bld: 84 mg/dL (ref 65–139)
Potassium: 4.9 mmol/L (ref 3.5–5.3)
Sodium: 137 mmol/L (ref 135–146)
Total Bilirubin: 0.4 mg/dL (ref 0.2–1.2)
Total Protein: 7.2 g/dL (ref 6.1–8.1)
eGFR: 95 mL/min/{1.73_m2} (ref >=60)

## 2021-11-16 LAB — LIPID PANEL
Cholesterol: 178 mg/dL (ref <200)
HDL: 52 mg/dL (ref >=40)
LDL Cholesterol (Calc): 106 mg/dL (calc) — ABNORMAL HIGH
Non-HDL Cholesterol (Calc): 126 mg/dL (calc) (ref <130)
Total CHOL/HDL Ratio: 3.4 (calc) (ref <5.0)
Triglycerides: 106 mg/dL (ref <150)

## 2021-11-16 LAB — HEPATITIS C ANTIBODY: Hepatitis C Ab: NONREACTIVE

## 2021-11-16 LAB — TESTOSTERONE: Testosterone: 512 ng/dL (ref 250–827)

## 2021-11-17 ENCOUNTER — Ambulatory Visit (INDEPENDENT_AMBULATORY_CARE_PROVIDER_SITE_OTHER): Payer: BC Managed Care – PPO | Admitting: Internal Medicine

## 2021-11-17 ENCOUNTER — Encounter: Payer: Self-pay | Admitting: Internal Medicine

## 2021-11-17 VITALS — BP 124/72 | HR 81 | Temp 98.0°F | Ht 67.0 in | Wt 163.0 lb

## 2021-11-17 DIAGNOSIS — Z6825 Body mass index (BMI) 25.0-25.9, adult: Secondary | ICD-10-CM

## 2021-11-17 DIAGNOSIS — E663 Overweight: Secondary | ICD-10-CM

## 2021-11-17 DIAGNOSIS — Z0001 Encounter for general adult medical examination with abnormal findings: Secondary | ICD-10-CM

## 2021-11-17 NOTE — Progress Notes (Signed)
Subjective:    Patient ID: Jack Hicks, male    DOB: [birth year], 30 y.o.   MRN: [id number]  HPI  Patient presents to clinic today for his annual exam.  Flu: 01/2019 Tetanus: 05/2015 COVID: Pfizer x 2 Dentist: annually  Diet: He does eat meat. He consumes some fruits and veggies. He tries to avoid fried foods. He drinks mostly water. Exercise: Gym 4-5 days/week, lifting and cardio  Review of Systems  Past Medical History:  Diagnosis Date   Allergy    Heart murmur    as child   History of chicken pox    Thyroid disease    borderline Hypothyroidism     Current Outpatient Medications  Medication Sig Dispense Refill   clomiPHENE (CLOMID) 50 MG tablet Take 25 mg by mouth daily.     omeprazole (PRILOSEC) 40 MG capsule Take 1 capsule (40 mg total) by mouth 2 (two) times daily before a meal. 60 capsule 1   No current facility-administered medications for this visit.    No Known Allergies  Family History  Problem Relation Age of Onset   Hyperlipidemia Mother    Hypertension Mother    Hyperlipidemia Father    Hypertension Father    Hyperlipidemia Maternal Grandmother    Hypertension Maternal Grandmother    Prostate cancer Maternal Grandfather    Hyperlipidemia Maternal Grandfather    Stroke Maternal Grandfather    Hypertension Maternal Grandfather    Hyperlipidemia Paternal Grandmother    Hypertension Paternal Grandmother    Arthritis Paternal Grandfather    Hyperlipidemia Paternal Grandfather    Hypertension Paternal Grandfather     Social History   Socioeconomic History   Marital status: Married    Spouse name: Not on file   Number of children: Not on file   Years of education: Not on file   Highest education level: Not on file  Occupational History   Not on file  Tobacco Use   Smoking status: Never   Smokeless tobacco: Never  Vaping Use   Vaping Use: Never used  Substance and Sexual Activity   Alcohol use: Yes    Alcohol/week: 1.0 standard drink  of alcohol    Types: 1 Standard drinks or equivalent per week   Drug use: No   Sexual activity: Yes  Other Topics Concern   Not on file  Social History Narrative   Not on file   Social Determinants of Health   Financial Resource Strain: Not on file  Food Insecurity: Not on file  Transportation Needs: Not on file  Physical Activity: Not on file  Stress: Not on file  Social Connections: Not on file  Intimate Partner Violence: Not on file     Constitutional: Denies fever, malaise, fatigue, headache or abrupt weight changes.  HEENT: Denies eye pain, eye redness, ear pain, ringing in the ears, wax buildup, runny nose, nasal congestion, bloody nose, or sore throat. Respiratory: Denies difficulty breathing, shortness of breath, cough or sputum production.   Cardiovascular:Pt reports 1 episode left side chest pain. Denies chest tightness, palpitations or swelling in the hands or feet.  Gastrointestinal: Denies abdominal pain, bloating, constipation, diarrhea or blood in the stool.  GU: Denies urgency, frequency, pain with urination, burning sensation, blood in urine, odor or discharge. Musculoskeletal: Denies decrease in range of motion, difficulty with gait, muscle pain or joint pain and swelling.  Skin: Denies redness, rashes, lesions or ulcercations.  Neurological: Denies dizziness, difficulty with memory, difficulty with speech or problems with balance  and coordination.  Psych: Patient has a history of anxiety.  Denies depression, SI/HI.  No other specific complaints in a complete review of systems (except as listed in HPI above).     Objective:   Physical Exam  BP 124/72 (BP Location: Right Arm, Patient Position: Sitting, Cuff Size: Normal)   Pulse 81   Temp 98 F (36.7 C) (Temporal)   Ht 5\' 7"  (1.702 m)   Wt 163 lb (73.9 kg)   SpO2 97%   BMI 25.53 kg/m   Wt Readings from Last 3 Encounters:  09/14/21 171 lb 2 oz (77.6 kg)  08/07/21 156 lb 8.4 oz (71 kg)  04/20/21 156  lb 9.6 oz (71 kg)    General: Appears his stated age, overweight, in NAD. Skin: Warm, dry and intact.  Patient has scarring to left hand. HEENT: Head: normal shape and size; Eyes: sclera white, no icterus, conjunctiva pink, PERRLA and EOMs intact; Neck:  Neck supple, trachea midline. No masses, lumps or thyromegaly present.  Cardiovascular: Normal rate and rhythm. S1,S2 noted.  No murmur, rubs or gallops noted. No JVD or BLE edema.  Pulmonary/Chest: Normal effort and positive vesicular breath sounds. No respiratory distress. No wheezes, rales or ronchi noted.  Abdomen: Normal bowel sounds. Musculoskeletal: Strength 5/5 BUE/BLE. No difficulty with gait.  Neurological: Alert and oriented. Cranial nerves II-XII grossly intact. Coordination normal.  Psychiatric: Mood and affect normal. Behavior is normal. Judgment and thought content normal.    BMET    Component Value Date/Time   NA 137 11/15/2021 1114   K 4.9 11/15/2021 1114   CL 100 11/15/2021 1114   CO2 28 11/15/2021 1114   GLUCOSE 84 11/15/2021 1114   BUN 29 (H) 11/15/2021 1114   CREATININE 1.08 11/15/2021 1114   CALCIUM 9.5 11/15/2021 1114   GFRNONAA >60 04/25/2019 0817   GFRAA >60 04/25/2019 0817    Lipid Panel     Component Value Date/Time   CHOL 178 11/15/2021 1114   TRIG 106 11/15/2021 1114   HDL 52 11/15/2021 1114   CHOLHDL 3.4 11/15/2021 1114   VLDL 19.4 07/03/2019 1204   LDLCALC 106 (H) 11/15/2021 1114    CBC    Component Value Date/Time   WBC 6.5 11/15/2021 1114   RBC 5.48 11/15/2021 1114   HGB 15.9 11/15/2021 1114   HCT 48.6 11/15/2021 1114   PLT 273 11/15/2021 1114   MCV 88.7 11/15/2021 1114   MCH 29.0 11/15/2021 1114   MCHC 32.7 11/15/2021 1114   RDW 12.8 11/15/2021 1114    Hgb A1C Lab Results  Component Value Date   HGBA1C 5.5 09/22/2020           Assessment & Plan:   Preventative Health Maintenance:  Encouraged him to get a flu shot in the fall Tetanus UTD Encouraged him to  COVID-vaccine Encouraged him to consume a balanced diet and exercise regimen Advised him to see a dentist annually Recent labs reviewed with patient  RTC in 1 year, sooner if needed 11/22/2020, NP

## 2021-11-17 NOTE — Assessment & Plan Note (Signed)
Encouraged diet and exercise for weight loss ?

## 2021-11-17 NOTE — Patient Instructions (Signed)
Health Maintenance, Male Adopting a healthy lifestyle and getting preventive care are important in promoting health and wellness. Ask your health care provider about: The right schedule for you to have regular tests and exams. Things you can do on your own to prevent diseases and keep yourself healthy. What should I know about diet, weight, and exercise? Eat a healthy diet  Eat a diet that includes plenty of vegetables, fruits, low-fat dairy products, and lean protein. Do not eat a lot of foods that are high in solid fats, added sugars, or sodium. Maintain a healthy weight Body mass index (BMI) is a measurement that can be used to identify possible weight problems. It estimates body fat based on height and weight. Your health care provider can help determine your BMI and help you achieve or maintain a healthy weight. Get regular exercise Get regular exercise. This is one of the most important things you can do for your health. Most adults should: Exercise for at least 150 minutes each week. The exercise should increase your heart rate and make you sweat (moderate-intensity exercise). Do strengthening exercises at least twice a week. This is in addition to the moderate-intensity exercise. Spend less time sitting. Even light physical activity can be beneficial. Watch cholesterol and blood lipids Have your blood tested for lipids and cholesterol at 31 years of age, then have this test every 5 years. You may need to have your cholesterol levels checked more often if: Your lipid or cholesterol levels are high. You are older than 31 years of age. You are at high risk for heart disease. What should I know about cancer screening? Many types of cancers can be detected early and may often be prevented. Depending on your health history and family history, you may need to have cancer screening at various ages. This may include screening for: Colorectal cancer. Prostate cancer. Skin cancer. Lung  cancer. What should I know about heart disease, diabetes, and high blood pressure? Blood pressure and heart disease High blood pressure causes heart disease and increases the risk of stroke. This is more likely to develop in people who have high blood pressure readings or are overweight. Talk with your health care provider about your target blood pressure readings. Have your blood pressure checked: Every 3-5 years if you are 18-39 years of age. Every year if you are 40 years old or older. If you are between the ages of 65 and 75 and are a current or former smoker, ask your health care provider if you should have a one-time screening for abdominal aortic aneurysm (AAA). Diabetes Have regular diabetes screenings. This checks your fasting blood sugar level. Have the screening done: Once every three years after age 45 if you are at a normal weight and have a low risk for diabetes. More often and at a younger age if you are overweight or have a high risk for diabetes. What should I know about preventing infection? Hepatitis B If you have a higher risk for hepatitis B, you should be screened for this virus. Talk with your health care provider to find out if you are at risk for hepatitis B infection. Hepatitis C Blood testing is recommended for: Everyone born from 1945 through 1965. Anyone with known risk factors for hepatitis C. Sexually transmitted infections (STIs) You should be screened each year for STIs, including gonorrhea and chlamydia, if: You are sexually active and are younger than 31 years of age. You are older than 31 years of age and your   health care provider tells you that you are at risk for this type of infection. Your sexual activity has changed since you were last screened, and you are at increased risk for chlamydia or gonorrhea. Ask your health care provider if you are at risk. Ask your health care provider about whether you are at high risk for HIV. Your health care provider  may recommend a prescription medicine to help prevent HIV infection. If you choose to take medicine to prevent HIV, you should first get tested for HIV. You should then be tested every 3 months for as long as you are taking the medicine. Follow these instructions at home: Alcohol use Do not drink alcohol if your health care provider tells you not to drink. If you drink alcohol: Limit how much you have to 0-2 drinks a day. Know how much alcohol is in your drink. In the [place] one drink equals one 12 oz bottle of beer (355 mL), one 5 oz glass of wine (148 mL), or one 1 oz glass of hard liquor (44 mL). Lifestyle Do not use any products that contain nicotine or tobacco. These products include cigarettes, chewing tobacco, and vaping devices, such as e-cigarettes. If you need help quitting, ask your health care provider. Do not use street drugs. Do not share needles. Ask your health care provider for help if you need support or information about quitting drugs. General instructions Schedule regular health, dental, and eye exams. Stay current with your vaccines. Tell your health care provider if: You often feel depressed. You have ever been abused or do not feel safe at home. Summary Adopting a healthy lifestyle and getting preventive care are important in promoting health and wellness. Follow your health care provider's instructions about healthy diet, exercising, and getting tested or screened for diseases. Follow your health care provider's instructions on monitoring your cholesterol and blood pressure. This information is not intended to replace advice given to you by your health care provider. Make sure you discuss any questions you have with your health care provider. Document Revised: 08/23/2020 Document Reviewed: 08/23/2020 Elsevier Patient Education  2023 [employer] Inc.  

## 2021-11-23 DIAGNOSIS — R7989 Other specified abnormal findings of blood chemistry: Secondary | ICD-10-CM | POA: Diagnosis not present

## 2021-12-21 ENCOUNTER — Encounter: Payer: Self-pay | Admitting: Emergency Medicine

## 2021-12-21 ENCOUNTER — Emergency Department
Admission: EM | Admit: 2021-12-21 | Discharge: 2021-12-21 | Discharge status: Against Medical Advice | Payer: BC Managed Care – PPO | Source: Home / Self Care

## 2021-12-21 ENCOUNTER — Ambulatory Visit
Admission: EM | Admit: 2021-12-21 | Discharge: 2021-12-21 | Disposition: A | Discharge status: Home / Self Care | Payer: BC Managed Care – PPO

## 2021-12-21 DIAGNOSIS — R1013 Epigastric pain: Secondary | ICD-10-CM | POA: Diagnosis not present

## 2021-12-21 HISTORY — DX: Other specified abnormal findings of blood chemistry: R79.89

## 2021-12-21 NOTE — ED Triage Notes (Signed)
Patient c/o generalized ABD pain that started last night.   Patient denies diarrhea or fever. Patient denies dysuria.   Patient endorses nausea.   Patient endorses normal bowel movement this morning.   Patient denies any Alcohol use last night.   Patient has taken Tums and Simethicone with no relief of symptoms.

## 2021-12-21 NOTE — ED Provider Notes (Signed)
[facility]    CSN: [id number] Arrival date & time: 12/21/21  0930      History   Chief Complaint No chief complaint on file.   HPI Jack Hicks is a 31 y.o. male.   Patient presents with generalized abdominal pain beginning last night interfering with sleep.  Endorses pain is predominantly centralized but also feels as if there is a band wrapped around the stomach.  Pain has been constant and is described as a dull sensation with intermittent cramping, rated a 7 out of 10.  Had intermittent waves of nausea without vomiting.  Has not attempted to eat or drink this morning due to symptoms.  Has attempted use of Tums and simethicone which have been ineffective.  Other members of household has similar symptoms.  Denies changes in diet, recent travel, consumption of raw foods, diarrhea, fever, chills or URI symptoms.  Last bowel movement this morning described as normal, formed and brown.  Has all organs.  No pertinent medical history.  Past Medical History:  Diagnosis Date   Allergy    Heart murmur    as child   History of chicken pox    Low testosterone    Thyroid disease    borderline Hypothyroidism     Patient Active Problem List   Diagnosis Date Noted   HLD (hyperlipidemia) 09/27/2020   Overweight with body mass index (BMI) of 25 to 25.9 in adult 09/27/2020   Anxiety 07/03/2019    Past Surgical History:  Procedure Laterality Date   TISSUE GRAFT Left 2017       Home Medications    Prior to Admission medications   Medication Sig Start Date End Date Taking? Authorizing Provider  clomiPHENE (CLOMID) 50 MG tablet Take 25 mg by mouth daily. 04/03/21  Yes [provider]    Family History Family History  Problem Relation Age of Onset   Hyperlipidemia Mother    Hypertension Mother    Hyperlipidemia Father    Hypertension Father    Healthy Sister    Hyperlipidemia Maternal Grandmother    Hypertension Maternal Grandmother    Prostate cancer  Maternal Grandfather    Hyperlipidemia Maternal Grandfather    Stroke Maternal Grandfather    Hypertension Maternal Grandfather    Hyperlipidemia Paternal Grandmother    Hypertension Paternal Grandmother    Arthritis Paternal Grandfather    Hyperlipidemia Paternal Grandfather    Hypertension Paternal Grandfather     Social History Social History   Tobacco Use   Smoking status: Never   Smokeless tobacco: Never  Vaping Use   Vaping Use: Never used  Substance Use Topics   Alcohol use: Yes    Alcohol/week: 1.0 standard drink of alcohol    Types: 1 Standard drinks or equivalent per week   Drug use: No     Allergies   Patient has no known allergies.   Review of Systems Review of Systems  Constitutional: Negative.   HENT: Negative.    Respiratory: Negative.    Cardiovascular: Negative.   Gastrointestinal:  Positive for abdominal pain. Negative for abdominal distention, anal bleeding, blood in stool, constipation, diarrhea, nausea, rectal pain and vomiting.     Physical Exam Triage Vital Signs ED Triage Vitals  Enc Vitals Group     BP 12/21/21 0936 130/89     Pulse Rate 12/21/21 0936 72     Resp 12/21/21 0936 14     Temp 12/21/21 0936 97.8 F (36.6 C)     Temp  Source 12/21/21 0936 Oral     SpO2 12/21/21 0936 97 %     Weight --      Height --      Head Circumference --      Peak Flow --      Pain Score 12/21/21 0940 7     Pain Loc --      Pain Edu? --      Excl. in GC? --    No data found.  Updated Vital Signs BP 130/89 (BP Location: Left Arm)   Pulse 72   Temp 97.8 F (36.6 C) (Oral)   Resp 14   SpO2 97%   Visual Acuity Right Eye Distance:   Left Eye Distance:   Bilateral Distance:    Right Eye Near:   Left Eye Near:    Bilateral Near:     Physical Exam Constitutional:      Appearance: Normal appearance.  HENT:     Head: Normocephalic.  Eyes:     Extraocular Movements: Extraocular movements intact.  Pulmonary:     Effort: Pulmonary  effort is normal.  Abdominal:     General: Abdomen is flat. Bowel sounds are normal. There is no distension.     Palpations: Abdomen is soft.     Tenderness: There is abdominal tenderness in the epigastric area. There is no guarding.  Skin:    General: Skin is warm and dry.  Neurological:     Mental Status: He is alert and oriented to person, place, and time. Mental status is at baseline.  Psychiatric:        Mood and Affect: Mood normal.        Behavior: Behavior normal.      UC Treatments / Results  Labs (all labs ordered are listed, but only abnormal results are displayed) Labs Reviewed - No data to display  EKG   Radiology No results found.  Procedures Procedures (including critical care time)  Medications Ordered in UC Medications - No data to display  Initial Impression / Assessment and Plan / UC Course  I have reviewed the triage vital signs and the nursing notes.  Pertinent labs & imaging results that were available during my care of the patient were reviewed by me and considered in my medical decision making (see chart for details).  Epigastric pain  Mild tenderness is noted to the abdominal exam, described more as a discomfort than pain, vital signs are stable patient is in no signs of distress nor toxic appearing, low suspicion for an acute abdomen or infectious cause due to lack of accompanying symptoms, etiology is most likely increased gas production as patient endorses that he also feels bloated, recommended a watchful wait and continued use of simethicone, Pepto-Bismol and Maalox, recommend increase fluid intake until able to tolerate foods, may eat a bland diet while symptoms are present, given strict precautions that if no improvement within 48 hours or symptoms worsen at any point he is to return for reevaluation, discussed with patient that at this location we do not have the capabilities to complete abdominal imaging and therefore recommended follow-up at  one of our accompanying locations Final Clinical Impressions(s) / UC Diagnoses   Final diagnoses:  Epigastric pain     Discharge Instructions      Today I believe your symptoms are related to gas, I have a low suspicion for more serious organ involvement based on the fact you do not have severe pain, your vital signs are stable nor  are you ill-appearing and that you have no accompanying symptoms such as diarrhea, vomiting  I would like you to continue to take medications such as Tums, simethicone, Pepto-Bismol or Maalox to help reduce stomach acid and reduce gas which I believe will help with your symptoms  However if you do not see any improvement after 48 hours of supportive care or any point if your symptoms worsen please return for reevaluation, this [place] urgent care location does not have the capabilities to image her stomach and therefore we will need to go to her [place] location or a different urgent care or emergency department in this location  You may take Tylenol or ibuprofen as needed for pain  While your symptoms are present please increase your fluid intake if you are able to tolerate fluids, if attempting to eat, eat a blander diet avoiding spicy or greasy foods which may cause more stomach upset    ED Prescriptions   None    PDMP not reviewed this encounter.   Valinda Hoar, NP 12/21/21 1005

## 2021-12-21 NOTE — Discharge Instructions (Signed)
Today I believe your symptoms are related to gas, I have a low suspicion for more serious organ involvement based on the fact you do not have severe pain, your vital signs are stable nor are you ill-appearing and that you have no accompanying symptoms such as diarrhea, vomiting  I would like you to continue to take medications such as Tums, simethicone, Pepto-Bismol or Maalox to help reduce stomach acid and reduce gas which I believe will help with your symptoms  However if you do not see any improvement after 48 hours of supportive care or any point if your symptoms worsen please return for reevaluation, this [place] urgent care location does not have the capabilities to image her stomach and therefore we will need to go to her [place] location or a different urgent care or emergency department in this location  You may take Tylenol or ibuprofen as needed for pain  While your symptoms are present please increase your fluid intake if you are able to tolerate fluids, if attempting to eat, eat a blander diet avoiding spicy or greasy foods which may cause more stomach upset

## 2021-12-22 ENCOUNTER — Emergency Department
Admission: EM | Admit: 2021-12-22 | Discharge: 2021-12-22 | Disposition: A | Discharge status: Home / Self Care | Payer: BC Managed Care – PPO | Source: Home / Self Care | Attending: Emergency Medicine | Admitting: Emergency Medicine

## 2021-12-22 ENCOUNTER — Emergency Department: Payer: BC Managed Care – PPO

## 2021-12-22 ENCOUNTER — Emergency Department
Admission: EM | Admit: 2021-12-22 | Discharge: 2021-12-22 | Disposition: A | Discharge status: Home / Self Care | Payer: BC Managed Care – PPO | Attending: Emergency Medicine | Admitting: Emergency Medicine

## 2021-12-22 ENCOUNTER — Other Ambulatory Visit: Payer: Self-pay | Admitting: Internal Medicine

## 2021-12-22 ENCOUNTER — Ambulatory Visit: Payer: Self-pay

## 2021-12-22 ENCOUNTER — Other Ambulatory Visit: Payer: Self-pay

## 2021-12-22 ENCOUNTER — Encounter: Payer: Self-pay | Admitting: Emergency Medicine

## 2021-12-22 DIAGNOSIS — R110 Nausea: Secondary | ICD-10-CM | POA: Diagnosis not present

## 2021-12-22 DIAGNOSIS — E039 Hypothyroidism, unspecified: Secondary | ICD-10-CM | POA: Diagnosis not present

## 2021-12-22 DIAGNOSIS — K2900 Acute gastritis without bleeding: Secondary | ICD-10-CM | POA: Insufficient documentation

## 2021-12-22 DIAGNOSIS — R1084 Generalized abdominal pain: Secondary | ICD-10-CM | POA: Diagnosis not present

## 2021-12-22 DIAGNOSIS — Z79899 Other long term (current) drug therapy: Secondary | ICD-10-CM | POA: Diagnosis not present

## 2021-12-22 DIAGNOSIS — Z20822 Contact with and (suspected) exposure to covid-19: Secondary | ICD-10-CM | POA: Insufficient documentation

## 2021-12-22 DIAGNOSIS — R109 Unspecified abdominal pain: Secondary | ICD-10-CM | POA: Diagnosis not present

## 2021-12-22 DIAGNOSIS — R1011 Right upper quadrant pain: Secondary | ICD-10-CM | POA: Diagnosis not present

## 2021-12-22 LAB — RESP PANEL BY RT-PCR (FLU A&B, COVID) ARPGX2
Influenza A by PCR: NEGATIVE
Influenza B by PCR: NEGATIVE
SARS Coronavirus 2 by RT PCR: NEGATIVE

## 2021-12-22 LAB — URINALYSIS, ROUTINE W REFLEX MICROSCOPIC
Bilirubin Urine: NEGATIVE
Bilirubin Urine: NEGATIVE
Glucose, UA: NEGATIVE mg/dL
Glucose, UA: NEGATIVE mg/dL
Hgb urine dipstick: NEGATIVE
Hgb urine dipstick: NEGATIVE
Ketones, ur: NEGATIVE mg/dL
Ketones, ur: NEGATIVE mg/dL
Leukocytes,Ua: NEGATIVE
Leukocytes,Ua: NEGATIVE
Nitrite: NEGATIVE
Nitrite: NEGATIVE
Protein, ur: NEGATIVE mg/dL
Protein, ur: NEGATIVE mg/dL
Specific Gravity, Urine: 1.014 (ref 1.005–1.030)
Specific Gravity, Urine: 1.02 (ref 1.005–1.030)
pH: 7 (ref 5.0–8.0)
pH: 7 (ref 5.0–8.0)

## 2021-12-22 LAB — CBC WITH DIFFERENTIAL/PLATELET
Abs Immature Granulocytes: 0.02 10*3/uL (ref 0.00–0.07)
Abs Immature Granulocytes: 0.02 10*3/uL (ref 0.00–0.07)
Basophils Absolute: 0 10*3/uL (ref 0.0–0.1)
Basophils Absolute: 0.1 10*3/uL (ref 0.0–0.1)
Basophils Relative: 1 %
Basophils Relative: 1 %
Eosinophils Absolute: 0 10*3/uL (ref 0.0–0.5)
Eosinophils Absolute: 0.1 10*3/uL (ref 0.0–0.5)
Eosinophils Relative: 0 %
Eosinophils Relative: 1 %
HCT: 48.5 % (ref 39.0–52.0)
HCT: 50.3 % (ref 39.0–52.0)
Hemoglobin: 15.7 g/dL (ref 13.0–17.0)
Hemoglobin: 16.5 g/dL (ref 13.0–17.0)
Immature Granulocytes: 0 %
Immature Granulocytes: 0 %
Lymphocytes Relative: 24 %
Lymphocytes Relative: 29 %
Lymphs Abs: 1.8 10*3/uL (ref 0.7–4.0)
Lymphs Abs: 2.6 10*3/uL (ref 0.7–4.0)
MCH: 28.4 pg (ref 26.0–34.0)
MCH: 28.9 pg (ref 26.0–34.0)
MCHC: 32.4 g/dL (ref 30.0–36.0)
MCHC: 32.8 g/dL (ref 30.0–36.0)
MCV: 87.7 fL (ref 80.0–100.0)
MCV: 88.1 fL (ref 80.0–100.0)
Monocytes Absolute: 0.6 10*3/uL (ref 0.1–1.0)
Monocytes Absolute: 0.6 10*3/uL (ref 0.1–1.0)
Monocytes Relative: 7 %
Monocytes Relative: 7 %
Neutro Abs: 5.1 10*3/uL (ref 1.7–7.7)
Neutro Abs: 5.5 10*3/uL (ref 1.7–7.7)
Neutrophils Relative %: 62 %
Neutrophils Relative %: 68 %
Platelets: 295 10*3/uL (ref 150–400)
Platelets: 305 10*3/uL (ref 150–400)
RBC: 5.53 MIL/uL (ref 4.22–5.81)
RBC: 5.71 MIL/uL (ref 4.22–5.81)
RDW: 12.9 % (ref 11.5–15.5)
RDW: 13.1 % (ref 11.5–15.5)
WBC: 7.5 10*3/uL (ref 4.0–10.5)
WBC: 8.9 10*3/uL (ref 4.0–10.5)
nRBC: 0 % (ref 0.0–0.2)
nRBC: 0 % (ref 0.0–0.2)

## 2021-12-22 LAB — COMPREHENSIVE METABOLIC PANEL
ALT: 22 U/L (ref 0–44)
ALT: 23 U/L (ref 0–44)
AST: 22 U/L (ref 15–41)
AST: 23 U/L (ref 15–41)
Albumin: 4.2 g/dL (ref 3.5–5.0)
Albumin: 4.6 g/dL (ref 3.5–5.0)
Alkaline Phosphatase: 94 U/L (ref 38–126)
Alkaline Phosphatase: 98 U/L (ref 38–126)
Anion gap: 10 (ref 5–15)
Anion gap: 9 (ref 5–15)
BUN: 10 mg/dL (ref 6–20)
BUN: 11 mg/dL (ref 6–20)
CO2: 28 mmol/L (ref 22–32)
CO2: 28 mmol/L (ref 22–32)
Calcium: 9.3 mg/dL (ref 8.9–10.3)
Calcium: 9.4 mg/dL (ref 8.9–10.3)
Chloride: 103 mmol/L (ref 98–111)
Chloride: 104 mmol/L (ref 98–111)
Creatinine, Ser: 0.77 mg/dL (ref 0.61–1.24)
Creatinine, Ser: 0.89 mg/dL (ref 0.61–1.24)
GFR, Estimated: >60 mL/min (ref >60)
GFR, Estimated: >60 mL/min (ref >60)
Glucose, Bld: 118 mg/dL — ABNORMAL HIGH (ref 70–99)
Glucose, Bld: 130 mg/dL — ABNORMAL HIGH (ref 70–99)
Potassium: 4.4 mmol/L (ref 3.5–5.1)
Potassium: 4.6 mmol/L (ref 3.5–5.1)
Sodium: 140 mmol/L (ref 135–145)
Sodium: 142 mmol/L (ref 135–145)
Total Bilirubin: 0.4 mg/dL (ref 0.3–1.2)
Total Bilirubin: 0.5 mg/dL (ref 0.3–1.2)
Total Protein: 7.6 g/dL (ref 6.5–8.1)
Total Protein: 8.1 g/dL (ref 6.5–8.1)

## 2021-12-22 LAB — LIPASE, BLOOD
Lipase: 30 U/L (ref 11–51)
Lipase: 35 U/L (ref 11–51)

## 2021-12-22 LAB — TROPONIN I (HIGH SENSITIVITY): Troponin I (High Sensitivity): <2 ng/L (ref <18)

## 2021-12-22 MED ORDER — LIDOCAINE VISCOUS HCL 2 % MT SOLN
15.0000 mL | Freq: Once | OROMUCOSAL | Status: AC
  Administered 2021-12-22: 15 mL via OROMUCOSAL
  Filled 2021-12-22: qty 15

## 2021-12-22 MED ORDER — PANTOPRAZOLE SODIUM 40 MG IV SOLR
40.0000 mg | Freq: Once | INTRAVENOUS | Status: AC
  Administered 2021-12-22: 40 mg via INTRAVENOUS
  Filled 2021-12-22: qty 10

## 2021-12-22 MED ORDER — FAMOTIDINE 20 MG PO TABS: 20.0000 mg | ORAL_TABLET | Freq: Two times a day (BID) | ORAL | 0 refills | Status: DC

## 2021-12-22 MED ORDER — ONDANSETRON 4 MG PO TBDP: 4.0000 mg | ORAL_TABLET | Freq: Four times a day (QID) | ORAL | 0 refills | Status: DC | PRN

## 2021-12-22 MED ORDER — OMEPRAZOLE MAGNESIUM 20 MG PO TBEC: 40.0000 mg | DELAYED_RELEASE_TABLET | Freq: Every day | ORAL | 0 refills | Status: DC

## 2021-12-22 MED ORDER — KETOROLAC TROMETHAMINE 30 MG/ML IJ SOLN
30.0000 mg | Freq: Once | INTRAMUSCULAR | Status: AC
  Administered 2021-12-22: 30 mg via INTRAVENOUS
  Filled 2021-12-22: qty 1

## 2021-12-22 MED ORDER — ALUM & MAG HYDROXIDE-SIMETH 200-200-20 MG/5ML PO SUSP
30.0000 mL | Freq: Once | ORAL | Status: AC
  Administered 2021-12-22: 30 mL via ORAL
  Filled 2021-12-22: qty 30

## 2021-12-22 MED ORDER — DICYCLOMINE HCL 20 MG PO TABS: 20.0000 mg | ORAL_TABLET | Freq: Three times a day (TID) | ORAL | 0 refills | Status: DC | PRN

## 2021-12-22 MED ORDER — ONDANSETRON HCL 4 MG/2ML IJ SOLN
4.0000 mg | Freq: Once | INTRAMUSCULAR | Status: AC
  Administered 2021-12-22: 4 mg via INTRAVENOUS
  Filled 2021-12-22: qty 2

## 2021-12-22 MED ORDER — LIDOCAINE VISCOUS HCL 2 % MT SOLN: 15.0000 mL | Freq: Four times a day (QID) | OROMUCOSAL | 0 refills | Status: DC | PRN

## 2021-12-22 MED ORDER — SODIUM CHLORIDE 0.9 % IV BOLUS (SEPSIS)
1000.0000 mL | Freq: Once | INTRAVENOUS | Status: AC
  Administered 2021-12-22: 1000 mL via INTRAVENOUS

## 2021-12-22 MED ORDER — IOHEXOL 300 MG/ML  SOLN
100.0000 mL | Freq: Once | INTRAMUSCULAR | Status: AC | PRN
  Administered 2021-12-22: 100 mL via INTRAVENOUS

## 2021-12-22 NOTE — ED Provider Notes (Signed)
[facility] Provider Note    Event Date/Time   First MD Initiated Contact with Patient 12/22/21 1744     (approximate)   History   Abdominal Pain   HPI  Jack Hicks is a 31 y.o. male who presents today for evaluation of epigastric abdominal pain for the past 2 days.  Patient reports that he has been seen twice since that time but feels that his pain is not improving.  He has not had any vomiting or diarrhea.  He reports that the pain feels like burning and as if someone is punching him in the stomach.  He reports that he had tomato soup and his pain got worse right away.  He also reports that his pain is worsened when he lays on his right side or on his back.  He reports that he has been taking more ibuprofen than usual because he had a dental problem.  He is unsure exactly how much he was taking.  He does note that his pain began after taking the ibuprofen.  He denies any fevers or chills.  He has not had any black or bloody stool.  No testicular pain or swelling.  No dysuria.     Physical Exam   Triage Vital Signs: ED Triage Vitals  Enc Vitals Group     BP 12/22/21 1653 139/71     Pulse Rate 12/22/21 1653 75     Resp 12/22/21 1653 16     Temp 12/22/21 1653 98 F (36.7 C)     Temp src --      SpO2 12/22/21 1653 100 %     Weight 12/22/21 1724 159 lb 13.3 oz (72.5 kg)     Height 12/22/21 1724 5\' 7"  (1.702 m)     Head Circumference --      Peak Flow --      Pain Score --      Pain Loc --      Pain Edu? --      Excl. in GC? --     Most recent vital signs: Vitals:   12/22/21 1653  BP: 139/71  Pulse: 75  Resp: 16  Temp: 98 F (36.7 C)  SpO2: 100%    Physical Exam Vitals and nursing note reviewed.  Constitutional:      General: Awake and alert. No acute distress.    Appearance: Normal appearance. The patient is normal weight.  HENT:     Head: Normocephalic and atraumatic.     Mouth: Mucous membranes are moist.  Eyes:     General:  PERRL. Normal EOMs        Right eye: No discharge.        Left eye: No discharge.     Conjunctiva/sclera: Conjunctivae normal.  Cardiovascular:     Rate and Rhythm: Normal rate and regular rhythm.     Pulses: Normal pulses.     Heart sounds: Normal heart sounds Pulmonary:     Effort: Pulmonary effort is normal. No respiratory distress.     Breath sounds: Normal breath sounds.  Abdominal:     Abdomen is soft. There is minimal epigastric abdominal tenderness. No rebound or guarding. No distention. Musculoskeletal:        General: No swelling. Normal range of motion.     Cervical back: Normal range of motion and neck supple.  Skin:    General: Skin is warm and dry.     Capillary Refill: Capillary refill takes less than  2 seconds.     Findings: No rash.  Neurological:     Mental Status: The patient is awake and alert.      ED Results / Procedures / Treatments   Labs (all labs ordered are listed, but only abnormal results are displayed) Labs Reviewed  COMPREHENSIVE METABOLIC PANEL - Abnormal; Notable for the following components:      Result Value   Glucose, Bld 118 (*)    All other components within normal limits  URINALYSIS, ROUTINE W REFLEX MICROSCOPIC - Abnormal; Notable for the following components:   Color, Urine YELLOW (*)    APPearance CLEAR (*)    All other components within normal limits  RESP PANEL BY RT-PCR (FLU A&B, COVID) ARPGX2  CBC WITH DIFFERENTIAL/PLATELET  LIPASE, BLOOD     EKG     RADIOLOGY I independently reviewed and interpreted imaging and agree with radiologists findings.     PROCEDURES:  Critical Care performed:   Procedures   MEDICATIONS ORDERED IN ED: Medications  alum & mag hydroxide-simeth (MAALOX/MYLANTA) 200-200-20 MG/5ML suspension 30 mL (30 mLs Oral Given 12/22/21 1726)  lidocaine (XYLOCAINE) 2 % viscous mouth solution 15 mL (15 mLs Mouth/Throat Given 12/22/21 1727)  pantoprazole (PROTONIX) injection 40 mg (40 mg Intravenous  Given 12/22/21 1915)  alum & mag hydroxide-simeth (MAALOX/MYLANTA) 200-200-20 MG/5ML suspension 30 mL (30 mLs Oral Given 12/22/21 1913)  lidocaine (XYLOCAINE) 2 % viscous mouth solution 15 mL (15 mLs Mouth/Throat Given 12/22/21 1913)     IMPRESSION / MDM / ASSESSMENT AND PLAN / ED COURSE  I reviewed the triage vital signs and the nursing notes.   Differential diagnosis includes, but is not limited to,  gallbladder disease, pancreatitis, peptic ulcer disease, duodenal ulcer disease, gastritis.  Patient is awake and alert, hemodynamically stable and afebrile.  I reviewed his chart from yesterday and he had a normal CT scan.  Right upper quadrant ultrasound today obtained in triage demonstrates no abnormal findings.  Blood work yesterday was reassuring.  I did recommend repeating blood work today to make sure that he has not developed any abnormalities that may suggest a smoldering infection, he agrees.  He was given a GI cocktail in triage which caused significant improvement of his pain, but then his pain returned.  Also the history of having significant amounts of ibuprofen which he does not normally take just prior to his pain is suggestive of gastritis or ulcer.  Also suggestive given that his pain acutely worsened after tomato soup.  Patient is followed by Dr. Allegra Lai with gastroenterology given that he has a sliding hiatal hernia and lower esophageal ring.  She had recommended that he get an EGD and also take pantoprazole, however he has not been taking pantoprazole and canceled his EGD.  Repeat labs are reassuring.  He was given 1 dose of Protonix and a second GI cocktail as this provided him significant relief.  We discussed the importance of very close outpatient follow-up with gastroenterology for further management and work-up including a EGD which was recommended to him previously.  In the meantime, he was started on proton pump inhibitor and H2 blocker.  He admitted that he was supposed to be taking  a proton pump inhibitor already as recommended by his GI but has not been taking this.  He was advised that he may also take Mylanta in addition to viscous lidocaine as prescribed to help with his symptoms.  We discussed the importance of stopping all NSAIDs as well as gastric irritants including  caffeine, alcohol, tobacco, spicy foods, acidic foods.  He has a normal H&H, there is no black or bloody stool, do not suspect hemorrhage at this time.  We discussed strict return precautions.  Patient and his daughter numbers understand and agree with plan.  He was discharged in stable condition.   Patient's presentation is most consistent with acute complicated illness / injury requiring diagnostic workup.      FINAL CLINICAL IMPRESSION(S) / ED DIAGNOSES   Final diagnoses:  Acute gastritis without hemorrhage, unspecified gastritis type     Rx / DC Orders   ED Discharge Orders          Ordered    omeprazole (PRILOSEC OTC) 20 MG tablet  Daily        12/22/21 1933    famotidine (PEPCID) 20 MG tablet  2 times daily        12/22/21 1933    lidocaine (XYLOCAINE) 2 % solution  Every 6 hours PRN        12/22/21 1933             Note:  This document was prepared using Dragon voice recognition software and may include unintentional dictation errors.   Emeline Gins 12/22/21 2105    Vanessa [place], MD 12/26/21 1544

## 2021-12-22 NOTE — ED Triage Notes (Signed)
Pt arrives with c/o ABD pain that started Tuesday. Pt was seen for the same earlier today. Per pt, the pain is getting worse and cannot be relief.

## 2021-12-22 NOTE — ED Provider Triage Note (Signed)
Emergency Medicine Provider Triage Evaluation Note  Juliette Mangle, a 31 y.o. male  was evaluated in triage.  Pt complains of gastric abdominal pain.  Patient reports onset of symptoms on Tuesday.  He was seen earlier today for the same complaint with a work-up that included normal labs and normal CT scan.  He presents to the ED noting worsening pain without relief with Bentyl, Zofran, ibuprofen, Tylenol.  Review of Systems  Positive: Epigastric ABD pain Negative: FCS, vomiting, diarrhea  Physical Exam  BP 139/71   Pulse 75   Temp 98 F (36.7 C)   Resp 16   SpO2 100%  Gen:   Awake, no distress  NAD Resp:  Normal effort CTA MSK:   Moves extremities without difficulty  ABD:  Soft, nontender  Medical Decision Making  Medically screening exam initiated at 5:02 PM.  Appropriate orders placed.  AKSHITH MONCUS was informed that the remainder of the evaluation will be completed by another provider, this initial triage assessment does not replace that evaluation, and the importance of remaining in the ED until their evaluation is complete.  Patient returns to the ED with ongoing epigastric abdominal discomfort.   Lissa Hoard, PA-C 12/22/21 1704

## 2021-12-22 NOTE — ED Provider Notes (Signed)
[facility] Provider Note    Event Date/Time   First MD Initiated Contact with Patient 12/22/21 [phone]     (approximate)   History   Abdominal Pain   HPI  Jack Hicks is a 31 y.o. male with history of low testosterone on Clomid, mild hypothyroidism who presents to the emergency department complaints of intermittent generalized abdominal discomfort, nausea without vomiting, loose stools for the past 2 days.  No fevers, vomiting, diarrhea, dysuria.  No previous abdominal surgery.  Has tried simethicone, Mylanta without much relief.  States he feels bloated.   History provided by patient.    Past Medical History:  Diagnosis Date   Allergy    Heart murmur    as child   History of chicken pox    Low testosterone    Thyroid disease    borderline Hypothyroidism     Past Surgical History:  Procedure Laterality Date   TISSUE GRAFT Left 2017    MEDICATIONS:  Prior to Admission medications   Medication Sig Start Date End Date Taking? Authorizing Provider  clomiPHENE (CLOMID) 50 MG tablet Take 25 mg by mouth daily. 04/03/21   [provider]    Physical Exam   Triage Vital Signs: ED Triage Vitals  Enc Vitals Group     BP 12/22/21 0010 (!) 141/93     Pulse Rate 12/22/21 0010 86     Resp 12/22/21 0010 16     Temp 12/22/21 0010 98.2 F (36.8 C)     Temp Source 12/22/21 0010 Oral     SpO2 12/22/21 0010 99 %     Weight 12/22/21 0011 160 lb (72.6 kg)     Height 12/22/21 0011 5\' 7"  (1.702 m)     Head Circumference --      Peak Flow --      Pain Score 12/22/21 0011 3     Pain Loc --      Pain Edu? --      Excl. in GC? --     Most recent vital signs: Vitals:   12/22/21 0010 12/22/21 0515  BP: (!) 141/93 135/87  Pulse: 86 (!) 55  Resp: 16 16  Temp: 98.2 F (36.8 C) 98.1 F (36.7 C)  SpO2: 99% 100%    CONSTITUTIONAL: Alert and oriented and responds appropriately to questions. Well-appearing; well-nourished HEAD:  Normocephalic, atraumatic EYES: Conjunctivae clear, pupils appear equal, sclera nonicteric ENT: normal nose; moist mucous membranes NECK: Supple, normal ROM CARD: RRR; S1 and S2 appreciated; no murmurs, no clicks, no rubs, no gallops RESP: Normal chest excursion without splinting or tachypnea; breath sounds clear and equal bilaterally; no wheezes, no rhonchi, no rales, no hypoxia or respiratory distress, speaking full sentences ABD/GI: Normal bowel sounds; non-distended; soft, seems mildly tender to palpation diffusely without guarding or rebound BACK: The back appears normal EXT: Normal ROM in all joints; no deformity noted, no edema; no cyanosis SKIN: Normal color for age and race; warm; no rash on exposed skin NEURO: Moves all extremities equally, normal speech PSYCH: The patient's mood and manner are appropriate.   ED Results / Procedures / Treatments   LABS: (all labs ordered are listed, but only abnormal results are displayed) Labs Reviewed  COMPREHENSIVE METABOLIC PANEL - Abnormal; Notable for the following components:      Result Value   Glucose, Bld 130 (*)    All other components within normal limits  URINALYSIS, ROUTINE W REFLEX MICROSCOPIC - Abnormal; Notable for the following  components:   Color, Urine YELLOW (*)    APPearance CLEAR (*)    All other components within normal limits  CBC WITH DIFFERENTIAL/PLATELET  LIPASE, BLOOD  TROPONIN I (HIGH SENSITIVITY)     EKG:    Date: 12/22/2021 00:16  Rate: 70  Rhythm: normal sinus rhythm  QRS Axis: normal  Intervals: normal  ST/T Wave abnormalities: normal  Conduction Disutrbances: none  Narrative Interpretation: Small Q waves inferiorly, T wave inversions in III and aVF     RADIOLOGY: My personal review and interpretation of imaging: CT scan shows no acute abnormality.  I have personally reviewed all radiology reports.   CT ABDOMEN PELVIS W CONTRAST  Result Date: 12/22/2021 CLINICAL DATA:  Acute and  nonlocalized upper abdominal pain radiating around the back with nausea and bloating. EXAM: CT ABDOMEN AND PELVIS WITH CONTRAST TECHNIQUE: Multidetector CT imaging of the abdomen and pelvis was performed using the standard protocol following bolus administration of intravenous contrast. RADIATION DOSE REDUCTION: This exam was performed according to the departmental dose-optimization program which includes automated exposure control, adjustment of the mA and/or kV according to patient size and/or use of iterative reconstruction technique. CONTRAST:  OMNIPAQUE IOHEXOL 300 MG/ML  SOLN COMPARISON:  04/25/2019 FINDINGS: Lower chest:  No contributory findings. Hepatobiliary: No focal liver abnormality.No evidence of biliary obstruction or stone. Pancreas: Unremarkable. Spleen: Unremarkable. Adrenals/Urinary Tract: Negative adrenals. No hydronephrosis or ureteral stone. Small calculi in the upper poles of the bilateral kidney measuring up to 3 mm. Fat density lesion in the upper pole right kidney measuring 8 mm, consistent with angiomyolipoma. Unremarkable bladder. Stomach/Bowel:  No obstruction. No appendicitis. Vascular/Lymphatic: No acute vascular abnormality. No mass or adenopathy. Reproductive:No acute finding. Other: No ascites or pneumoperitoneum. Musculoskeletal: No acute abnormalities. Chronic L5 pars defects with mild L5-S1 anterolisthesis. IMPRESSION: 1. No acute finding. 2. Small bilateral renal calculi. 3. Chronic L5 pars defects. Electronically Signed   By: Tiburcio Pea M.D.   On: 12/22/2021 05:34     PROCEDURES:  Critical Care performed: No     Procedures    IMPRESSION / MDM / ASSESSMENT AND PLAN / ED COURSE  I reviewed the triage vital signs and the nursing notes.    Patient here with generalized abdominal pain.  Will intermittently start in the upper abdomen but now is in the lower abdomen.  Has had nausea and loose stools.  The patient is on the cardiac monitor to evaluate  for evidence of   DIFFERENTIAL DIAGNOSIS (includes but not limited to):   Viral gastroenteritis, colitis, GERD, biliary colic, cholecystitis, pancreatitis, less likely appendicitis   Patient's presentation is most consistent with acute presentation with potential threat to life or bodily function.   PLAN: Labs obtained from triage.  No leukocytosis.  Normal renal function, LFTs and lipase.  Urine shows no ketonuria or infection.  Troponin negative.  Will obtain CT of abdomen pelvis.  Will give IV fluids, Toradol and Zofran for symptomatic relief.   MEDICATIONS GIVEN IN ED: Medications  sodium chloride 0.9 % bolus 1,000 mL (1,000 mLs Intravenous New Bag/Given 12/22/21 0513)  ketorolac (TORADOL) 30 MG/ML injection 30 mg (30 mg Intravenous Given 12/22/21 0508)  ondansetron (ZOFRAN) injection 4 mg (4 mg Intravenous Given 12/22/21 0508)  iohexol (OMNIPAQUE) 300 MG/ML solution 100 mL (100 mLs Intravenous Contrast Given 12/22/21 0523)     ED COURSE: CT of the abdomen pelvis reviewed and interpreted by myself and the radiologist and shows no acute abnormality.  Appendix appears normal.  He has stones within the kidney but no ureterolithiasis, hydronephrosis.  Patient is feeling better and tolerating p.o.  Will discharge with Bentyl, Zofran.  Recommended bland diet.  Discussed supportive care instructions and return precautions.  He does have a PCP for follow-up.   At this time, I do not feel there is any life-threatening condition present. I reviewed all nursing notes, vitals, pertinent previous records.  All lab and urine results, EKGs, imaging ordered have been independently reviewed and interpreted by myself.  I reviewed all available radiology reports from any imaging ordered this visit.  Based on my assessment, I feel the patient is safe to be discharged home without further emergent workup and can continue workup as an outpatient as needed. Discussed all findings, treatment plan as well as usual and  customary return precautions.  They verbalize understanding and are comfortable with this plan.  Outpatient follow-up has been provided as needed.  All questions have been answered.    CONSULTS: No admission needed at this time.  Pain well controlled and patient tolerating p.o.  Work-up is unremarkable.   OUTSIDE RECORDS REVIEWED: Reviewed patient's last endocrinology note on 11/23/2021.       FINAL CLINICAL IMPRESSION(S) / ED DIAGNOSES   Final diagnoses:  Generalized abdominal pain     Rx / DC Orders   ED Discharge Orders          Ordered    ondansetron (ZOFRAN-ODT) 4 MG disintegrating tablet  Every 6 hours PRN        12/22/21 0606    dicyclomine (BENTYL) 20 MG tablet  Every 8 hours PRN        12/22/21 0606             Note:  This document was prepared using Dragon voice recognition software and may include unintentional dictation errors.   Krystyne Tewksbury, Layla Maw, DO 12/22/21 [phone]

## 2021-12-22 NOTE — Discharge Instructions (Addendum)
You have had normal blood tests, normal urine test, normal CAT scan, and normal ultrasound.  It is quite possible that you have gastritis or peptic ulcer disease especially given your recent ibuprofen use.  It is very important that you stop taking ibuprofen or other nonsteroidal anti-inflammatories, or steroid anti-inflammatories.  It is also important that you eat bland foods and stay away from alcohol, tobacco, spicy food, acidic food, caffeine, and other gastric irritants.  Please take the medications as prescribed. You may also take Mylanta or maalox per package instructions.  Please follow-up with your gastroenterologist as soon as possible for EGD.  Call her tomorrow.  Please return for any new, worsening, or change in symptoms or other concerns.

## 2021-12-22 NOTE — Discharge Instructions (Addendum)
Please return the emergency department if you have worsening abdominal pain, pain that is localized to the right lower abdomen, fever of 100.4 or higher, vomiting that does not stop, blood in your stool or black and tarry stools.

## 2021-12-22 NOTE — Telephone Encounter (Signed)
  Chief Complaint:constant severe abd pain above navel Symptoms: stomach hurts after eating, abdomen painful to touch, severe abd pain laying on right side, mild nausea- radiating pain from abdomen to back Frequency: onset Tuesday Pertinent Negatives: Patient denies diarrhea, fever, urination pain, vomiting Disposition: [x] ED /[] Urgent Care (no appt availability in office) / [] Appointment(In office/virtual)/ []  [facility]/ [] Home Care/ [] Refused Recommended Disposition /[]  Mobile Bus/ []  Follow-up with PCP Additional Notes: Attempted to teams message the [facility] and then called FC number x 2 without response. Reason for Disposition  [1] SEVERE pain (e.g., excruciating) AND [2] present > 1 hour  Answer Assessment - Initial Assessment Questions 1. LOCATION: "Where does it hurt?"      Above navel middle 2. RADIATION: "Does the pain shoot anywhere else?" (e.g., chest, back)     To abdomen to back when laying down worse on right side 3. ONSET: "When did the pain begin?" (Minutes, hours or days ago)      Tuesday  4. SUDDEN: "Gradual or sudden onset?"     gradually 5. PATTERN "Does the pain come and go, or is it constant?"    - If it comes and goes: "How long does it last?" "Do you have pain now?"     (Note: Comes and goes means the pain is intermittent. It goes away completely between bouts.)    - If constant: "Is it getting better, staying the same, or getting worse?"      (Note: Constant means the pain never goes away completely; most serious pain is constant and gets worse.)      constant 6. SEVERITY: "How bad is the pain?"  (e.g., Scale 1-10; mild, moderate, or severe)    - MILD (1-3): Doesn't interfere with normal activities, abdomen soft and not tender to touch.     - MODERATE (4-7): Interferes with normal activities or awakens from sleep, abdomen tender to touch.     - SEVERE (8-10): Excruciating pain, doubled over, unable to do any normal activities.        Severe-dull, cramping hurts to touch 7. RECURRENT SYMPTOM: "Have you ever had this type of stomach pain before?" If Yes, ask: "When was the last time?" and "What happened that time?"      no 8. CAUSE: "What do you think is causing the stomach pain?"    unsure 9. RELIEVING/AGGRAVATING FACTORS: "What makes it better or worse?" (e.g., antacids, bending or twisting motion, bowel movement)     Laying on right side 10. OTHER SYMPTOMS: "Do you have any other symptoms?" (e.g., back pain, diarrhea, fever, urination pain, vomiting)       Occ. Back pain, nausea, streams stops and goes on its own  Protocols used: Abdominal Pain - Male-A-AH

## 2021-12-22 NOTE — ED Triage Notes (Signed)
Patient ambulatory to triage with steady gait, without difficulty or distress noted ; pt reports upper abd pain radiating around into back accomp by nausea, bloating; denies hx of same

## 2021-12-23 ENCOUNTER — Encounter: Payer: Self-pay | Admitting: Internal Medicine

## 2021-12-23 ENCOUNTER — Other Ambulatory Visit: Payer: Self-pay

## 2021-12-23 ENCOUNTER — Encounter: Payer: Self-pay | Admitting: Gastroenterology

## 2021-12-23 ENCOUNTER — Telehealth: Payer: Self-pay | Admitting: Gastroenterology

## 2021-12-23 DIAGNOSIS — R1319 Other dysphagia: Secondary | ICD-10-CM

## 2021-12-23 NOTE — Telephone Encounter (Signed)
ED note has already been reviewed

## 2021-12-23 NOTE — Telephone Encounter (Signed)
Schedule patient for EGD for 12/27/21 with Dr. Allegra Lai in [place]. Sent instructions to [employer]. Made follow up appointment

## 2021-12-23 NOTE — Telephone Encounter (Signed)
Patient was seen in the ER for Actute Gastritis.Marland Kitchen He went to the ED again yesterday for generalized abdominal pain. And on 12/21/2021 epigastric pain. He canceled his EGD for 09/16/21. Please advise if you still want EGD for dysphagia and does he need follow up appointment and when

## 2021-12-23 NOTE — Telephone Encounter (Signed)
Unable to refill per protocol, Rx expired. Medication is npt on current list. Will refuse medication request.  Requested Prescriptions  Pending Prescriptions Disp Refills  . hydrOXYzine (ATARAX) 10 MG/5ML syrup [Pharmacy Med Name: HYDROXYZINE 10 MG/5 ML SYRUP] 240 mL 0    Sig: TAKE 12.5 ML BY MOUTH 3 (THREE) TIMES DAILY AS NEEDED.     Ear, Nose, and Throat:  Antihistamines 2 Passed - 12/22/2021 11:10 AM      Passed - Cr in normal range and within 360 days    Creat  Date Value Ref Range Status  11/15/2021 1.08 0.60 - 1.26 mg/dL Final   Creatinine, Ser  Date Value Ref Range Status  12/22/2021 0.89 0.61 - 1.24 mg/dL Final         Passed - Valid encounter within last 12 months    Recent Outpatient Visits          1 month ago Encounter for general adult medical examination with abnormal findings   [facility] [place], Salvadore Oxford, NP   8 months ago Acute nasopharyngitis   [facility] [place], Salvadore Oxford, NP   1 year ago Pure hypercholesterolemia   [facility] [place], Salvadore Oxford, NP   1 year ago Encounter for general adult medical examination w/o abnormal findings   [facility] [place], Salvadore Oxford, NP   1 year ago Acute non-recurrent ethmoidal sinusitis   [facility] [place], Salvadore Oxford, NP      Future Appointments            In 6 days [place], Salvadore Oxford, NP [facility]   In 3 weeks Allegra Lai, Loel Dubonnet, MD [facility]

## 2021-12-23 NOTE — Telephone Encounter (Signed)
I reviewed his labs, ER notes and imaging studies.  I recommend that he gets an EGD ASAP.  And, I can see him in the office after the EGD  RV

## 2021-12-23 NOTE — Telephone Encounter (Signed)
Pt was seen in ER he was told he needed a upper egd

## 2021-12-25 ENCOUNTER — Emergency Department (HOSPITAL_COMMUNITY)
Admission: EM | Admit: 2021-12-25 | Discharge: 2021-12-25 | Discharge status: Against Medical Advice | Payer: BC Managed Care – PPO | Attending: Emergency Medicine | Admitting: Emergency Medicine

## 2021-12-25 ENCOUNTER — Emergency Department
Admission: EM | Admit: 2021-12-25 | Discharge: 2021-12-25 | Disposition: A | Discharge status: Home / Self Care | Payer: BC Managed Care – PPO | Attending: Emergency Medicine | Admitting: Emergency Medicine

## 2021-12-25 ENCOUNTER — Encounter: Payer: Self-pay | Admitting: Emergency Medicine

## 2021-12-25 ENCOUNTER — Encounter (HOSPITAL_COMMUNITY): Payer: Self-pay | Admitting: Emergency Medicine

## 2021-12-25 ENCOUNTER — Other Ambulatory Visit: Payer: Self-pay

## 2021-12-25 DIAGNOSIS — R1084 Generalized abdominal pain: Secondary | ICD-10-CM

## 2021-12-25 DIAGNOSIS — E039 Hypothyroidism, unspecified: Secondary | ICD-10-CM | POA: Insufficient documentation

## 2021-12-25 DIAGNOSIS — Z5321 Procedure and treatment not carried out due to patient leaving prior to being seen by health care provider: Secondary | ICD-10-CM | POA: Diagnosis not present

## 2021-12-25 DIAGNOSIS — R109 Unspecified abdominal pain: Secondary | ICD-10-CM | POA: Insufficient documentation

## 2021-12-25 LAB — COMPREHENSIVE METABOLIC PANEL
ALT: 25 U/L (ref 0–44)
AST: 25 U/L (ref 15–41)
Albumin: 4.1 g/dL (ref 3.5–5.0)
Alkaline Phosphatase: 97 U/L (ref 38–126)
Anion gap: 10 (ref 5–15)
BUN: 20 mg/dL (ref 6–20)
CO2: 28 mmol/L (ref 22–32)
Calcium: 9.1 mg/dL (ref 8.9–10.3)
Chloride: 99 mmol/L (ref 98–111)
Creatinine, Ser: 1.03 mg/dL (ref 0.61–1.24)
GFR, Estimated: >60 mL/min (ref >60)
Glucose, Bld: 117 mg/dL — ABNORMAL HIGH (ref 70–99)
Potassium: 3.8 mmol/L (ref 3.5–5.1)
Sodium: 137 mmol/L (ref 135–145)
Total Bilirubin: 0.2 mg/dL — ABNORMAL LOW (ref 0.3–1.2)
Total Protein: 7.2 g/dL (ref 6.5–8.1)

## 2021-12-25 LAB — URINALYSIS, ROUTINE W REFLEX MICROSCOPIC
Bilirubin Urine: NEGATIVE
Glucose, UA: NEGATIVE mg/dL
Hgb urine dipstick: NEGATIVE
Ketones, ur: NEGATIVE mg/dL
Leukocytes,Ua: NEGATIVE
Nitrite: NEGATIVE
Protein, ur: NEGATIVE mg/dL
Specific Gravity, Urine: 1.018 (ref 1.005–1.030)
pH: 6 (ref 5.0–8.0)

## 2021-12-25 LAB — CBC
HCT: 47.9 % (ref 39.0–52.0)
Hemoglobin: 16.5 g/dL (ref 13.0–17.0)
MCH: 30 pg (ref 26.0–34.0)
MCHC: 34.4 g/dL (ref 30.0–36.0)
MCV: 87.1 fL (ref 80.0–100.0)
Platelets: 268 10*3/uL (ref 150–400)
RBC: 5.5 MIL/uL (ref 4.22–5.81)
RDW: 12.5 % (ref 11.5–15.5)
WBC: 6.1 10*3/uL (ref 4.0–10.5)
nRBC: 0 % (ref 0.0–0.2)

## 2021-12-25 LAB — LIPASE, BLOOD: Lipase: 34 U/L (ref 11–51)

## 2021-12-25 MED ORDER — HYDROCODONE-ACETAMINOPHEN 5-325 MG PO TABS: 1.0000 | ORAL_TABLET | ORAL | 0 refills | Status: DC | PRN

## 2021-12-25 MED ORDER — HYDROCODONE-ACETAMINOPHEN 7.5-325 MG/15ML PO SOLN: 10.0000 mL | Freq: Four times a day (QID) | ORAL | 0 refills | Status: AC | PRN

## 2021-12-25 MED ORDER — ONDANSETRON HCL 4 MG/2ML IJ SOLN
4.0000 mg | Freq: Once | INTRAMUSCULAR | Status: AC
  Administered 2021-12-25: 4 mg via INTRAVENOUS
  Filled 2021-12-25: qty 2

## 2021-12-25 MED ORDER — SODIUM CHLORIDE 0.9 % IV BOLUS (SEPSIS)
1000.0000 mL | Freq: Once | INTRAVENOUS | Status: AC
  Administered 2021-12-25: 1000 mL via INTRAVENOUS

## 2021-12-25 MED ORDER — MORPHINE SULFATE (PF) 4 MG/ML IV SOLN
4.0000 mg | Freq: Once | INTRAVENOUS | Status: AC
  Administered 2021-12-25: 4 mg via INTRAVENOUS
  Filled 2021-12-25: qty 1

## 2021-12-25 MED ORDER — ONDANSETRON 4 MG PO TBDP: 4.0000 mg | ORAL_TABLET | Freq: Four times a day (QID) | ORAL | 0 refills | Status: DC | PRN

## 2021-12-25 NOTE — ED Triage Notes (Signed)
Patient reports persistent left abdominal pain onset this week , no emesis or diarrhea , denies fever or chills , diagnosed with gastritis last 12/22/21 at [facility] ER .

## 2021-12-25 NOTE — ED Provider Notes (Signed)
[facility] Provider Note    Event Date/Time   First MD Initiated Contact with Patient 12/25/21 [phone]     (approximate)   History   Abdominal Pain   HPI  Jack Hicks is a 31 y.o. male history of borderline hypothyroidism, low testosterone who presents to the emergency department generalized abdominal pain that has been ongoing for several days.  Pain waxes and wanes and will change position multiple times.  Worse at night.  Has been taking acetaminophen without relief.  States he needs something stronger at home to control his pain.  No vomiting, diarrhea, fever.  Has had multiple work-ups in the past few days at urgent care and ED's for the same with negative labs, urine, CT of the abdomen pelvis, right upper quadrant ultrasound.  Has an appointment for endoscopy with Dr. Allegra Lai in 2 days.   History provided by patient and mother and father.    Past Medical History:  Diagnosis Date   Allergy    Heart murmur    as child   History of chicken pox    Low testosterone    Thyroid disease    borderline Hypothyroidism     Past Surgical History:  Procedure Laterality Date   TISSUE GRAFT Left 2017    MEDICATIONS:  Prior to Admission medications   Medication Sig Start Date End Date Taking? Authorizing Provider  alum & mag hydroxide-simeth (MAALOX PLUS) 400-400-40 MG/5ML suspension Take 10 mLs by mouth every 6 (six) hours as needed for indigestion.    [provider]  clomiPHENE (CLOMID) 50 MG tablet Take 25 mg by mouth daily. 04/03/21   [provider]  dicyclomine (BENTYL) 20 MG tablet Take 1 tablet (20 mg total) by mouth every 8 (eight) hours as needed for spasms (Abdominal cramping). 12/22/21   Rutger Salton, Layla Maw, DO  famotidine (PEPCID) 20 MG tablet Take 1 tablet (20 mg total) by mouth 2 (two) times daily for 10 days. 12/22/21 01/01/22  Poggi, Eileen Stanford E, PA-C  lidocaine (XYLOCAINE) 2 % solution Use as directed 15 mLs in the mouth or throat  every 6 (six) hours as needed for mouth pain. 12/22/21   Poggi, Herb Grays, PA-C  omeprazole (PRILOSEC OTC) 20 MG tablet Take 2 tablets (40 mg total) by mouth daily for 28 days. 12/22/21 01/19/22  Poggi, Eileen Stanford E, PA-C  ondansetron (ZOFRAN-ODT) 4 MG disintegrating tablet Take 1 tablet (4 mg total) by mouth every 6 (six) hours as needed for nausea or vomiting. 12/22/21   Ayda Tancredi, Layla Maw, DO    Physical Exam   Triage Vital Signs: ED Triage Vitals  Enc Vitals Group     BP --      Pulse --      Resp --      Temp --      Temp src --      SpO2 --      Weight 12/25/21 0525 158 lb (71.7 kg)     Height 12/25/21 0525 5\' 7"  (1.702 m)     Head Circumference --      Peak Flow --      Pain Score 12/25/21 0524 8     Pain Loc --      Pain Edu? --      Excl. in GC? --     Most recent vital signs: Vitals:   12/25/21 0600 12/25/21 0630  BP: (!) 140/93 (!) 136/90  Pulse: 62 67  Resp: (!) 22 (!) 22  Temp:    SpO2: 99% 100%    CONSTITUTIONAL: Alert and oriented and responds appropriately to questions.  Peers uncomfortable but is nontoxic, afebrile HEAD: Normocephalic, atraumatic EYES: Conjunctivae clear, pupils appear equal, sclera nonicteric ENT: normal nose; moist mucous membranes NECK: Supple, normal ROM CARD: RRR; S1 and S2 appreciated; no murmurs, no clicks, no rubs, no gallops RESP: Normal chest excursion without splinting or tachypnea; breath sounds clear and equal bilaterally; no wheezes, no rhonchi, no rales, no hypoxia or respiratory distress, speaking full sentences ABD/GI: Normal bowel sounds; non-distended; soft, ender to palpation diffusely throughout the abdomen without guarding or rebound, no peritoneal signs BACK: The back appears normal EXT: Normal ROM in all joints; no deformity noted, no edema; no cyanosis SKIN: Normal color for age and race; warm; no rash on exposed skin NEURO: Moves all extremities equally, normal speech PSYCH: The patient's mood and manner are  appropriate.   ED Results / Procedures / Treatments   LABS: (all labs ordered are listed, but only abnormal results are displayed) Labs Reviewed - No data to display   EKG:    Date: 12/25/2021  Rate: 79  Rhythm: normal sinus rhythm  QRS Axis: normal  Intervals: normal  ST/T Wave abnormalities: normal  Conduction Disutrbances: none  Narrative Interpretation: unremarkable       RADIOLOGY: My personal review and interpretation of imaging:    I have personally reviewed all radiology reports.   No results found.   PROCEDURES:  Critical Care performed: No     Procedures    IMPRESSION / MDM / ASSESSMENT AND PLAN / ED COURSE  I reviewed the triage vital signs and the nursing notes.    Patient here with complaints of generalized abdominal pain that has been waxing and waning and changing position over the past few days.  Multiple negative work-ups in the ED and has an endoscopy scheduled with GI in 2 days.  The patient is on the cardiac monitor to evaluate for evidence of arrhythmia and/or significant heart rate changes.   DIFFERENTIAL DIAGNOSIS (includes but not limited to):   Gastritis, GERD, inflammatory bowel disease, IBS.  Doubt perforation, bowel obstruction, colitis, diverticulitis, appendicitis, cholecystitis, biliary colic, pancreatitis.   Patient's presentation is most consistent with acute presentation with potential threat to life or bodily function.   PLAN: Patient was just at [place] tonight and left without being seen.  There he had CBC, CMP, lipase and urinalysis done.  No leukocytosis.  Normal hemoglobin, electrolytes, renal function, LFTs, lipase.  Urine shows no sign of infection.  Have offered to repeat CT imaging today and discussed risk and benefits with patient and family.  They state they would like to hold off at this time and discuss it amongst themselves further.  He asked for IV medications for pain control.  We will give IV  fluids, morphine, Zofran and reassess.   MEDICATIONS GIVEN IN ED: Medications  morphine (PF) 4 MG/ML injection 4 mg (4 mg Intravenous Given 12/25/21 0555)  ondansetron (ZOFRAN) injection 4 mg (4 mg Intravenous Given 12/25/21 0556)  sodium chloride 0.9 % bolus 1,000 mL (1,000 mLs Intravenous New Bag/Given 12/25/21 0556)     ED COURSE: Patient states that he is feeling better however pain is starting to increase again but he declines any further pain medication.  When asked if he would like to proceed with CT imaging he states that he would like to discuss this further with me but does not want this conversation to  be "rushed" and unfortunately at this time there are multiple critical or emergent patients that have arrived to the ED requiring immediate attention.  We will come back to discuss further with patient.    I was unable to get back to see the patient for further discussion prior to shift change due to high acuity patients arriving in the ED.  Signed out to Dr. Vicente Males who will discuss with patient and family further regarding imaging.  I did send pain and nausea medicine to his pharmacy to get him through the next couple of days until he can see gastroenterology.  We did discuss that he may benefit from a colonoscopy as well given pain is not just in the upper abdomen.  Discussed that he would need to discuss this further with the gastroenterologist.   CONSULTS: No emergent consult needed at this time but dispo pending further work-up.   OUTSIDE RECORDS REVIEWED: Reviewed patient's previous endocrinology note on 11/23/2021.  Reviewed multiple recent urgent care and ED notes, labs, urine, imaging.       FINAL CLINICAL IMPRESSION(S) / ED DIAGNOSES   Final diagnoses:  Generalized abdominal pain     Rx / DC Orders   ED Discharge Orders          Ordered    HYDROcodone-acetaminophen (NORCO/VICODIN) 5-325 MG tablet  Every 4 hours PRN        12/25/21 0714    ondansetron  (ZOFRAN-ODT) 4 MG disintegrating tablet  Every 6 hours PRN        12/25/21 0714             Note:  This document was prepared using Dragon voice recognition software and may include unintentional dictation errors.   Melysa Schroyer, Layla Maw, DO 12/25/21 [phone]

## 2021-12-25 NOTE — ED Provider Notes (Signed)
Emergency department handoff note  Care of this patient was signed out to me at the end of the previous provider shift.  All pertinent patient information was conveyed and all questions were answered.  Patient pending discussion about possible repeat CT abdomen and pelvis.  Patient was informed that he has already had a negative work-up over the last 2 days and that a CT scan would likely not yield any further diagnostic advantage.  Patient does have follow-up in 2 days with gastroenterology for an upper GI study.  Patient will be provided with adequate analgesia until that procedure. The patient has been reexamined and is ready to be discharged.  All diagnostic results have been reviewed and discussed with the patient/family.  Care plan has been outlined and the patient/family understands all current diagnoses, results, and treatment plans.  There are no new complaints, changes, or physical findings at this time.  All questions have been addressed and answered.  All medications, if any, that were given while in the emergency department or any that are being prescribed have been reviewed with the patient/family.  All side effects and adverse reactions have been explained.  Patient was instructed to, and agrees to follow-up with their primary care physician as well as return to the emergency department if any new or worsening symptoms develop.   Merwyn Katos, MD 12/25/21 [phone]

## 2021-12-25 NOTE — ED Notes (Signed)
Pt left due to not being seen quick enough 

## 2021-12-25 NOTE — ED Notes (Signed)
Pt is A&Ox4. Pt has been dx with gastritis and has endoscopy procedure in 2 days. Pt is still having pain in his abd.

## 2021-12-25 NOTE — ED Triage Notes (Signed)
[facility], reports abdominal pain x several days.  Dx with gastritis 3 days ago.

## 2021-12-27 ENCOUNTER — Ambulatory Visit: Payer: BC Managed Care – PPO | Admitting: Anesthesiology

## 2021-12-27 ENCOUNTER — Encounter: Payer: Self-pay | Admitting: Gastroenterology

## 2021-12-27 ENCOUNTER — Other Ambulatory Visit: Payer: Self-pay

## 2021-12-27 ENCOUNTER — Encounter
Admission: RE | Disposition: A | Discharge status: Home / Self Care | Payer: Self-pay | Source: Home / Self Care | Attending: Gastroenterology

## 2021-12-27 ENCOUNTER — Telehealth: Payer: Self-pay

## 2021-12-27 ENCOUNTER — Ambulatory Visit
Admission: RE | Admit: 2021-12-27 | Discharge: 2021-12-27 | Disposition: A | Discharge status: Home / Self Care | Payer: BC Managed Care – PPO | Attending: Gastroenterology | Admitting: Gastroenterology

## 2021-12-27 DIAGNOSIS — R1010 Upper abdominal pain, unspecified: Secondary | ICD-10-CM

## 2021-12-27 DIAGNOSIS — R1310 Dysphagia, unspecified: Secondary | ICD-10-CM | POA: Insufficient documentation

## 2021-12-27 DIAGNOSIS — K2289 Other specified disease of esophagus: Secondary | ICD-10-CM | POA: Diagnosis not present

## 2021-12-27 DIAGNOSIS — E039 Hypothyroidism, unspecified: Secondary | ICD-10-CM | POA: Insufficient documentation

## 2021-12-27 DIAGNOSIS — R1319 Other dysphagia: Secondary | ICD-10-CM | POA: Diagnosis not present

## 2021-12-27 DIAGNOSIS — R1084 Generalized abdominal pain: Secondary | ICD-10-CM | POA: Diagnosis not present

## 2021-12-27 HISTORY — PX: ESOPHAGOGASTRODUODENOSCOPY (EGD) WITH PROPOFOL: SHX5813

## 2021-12-27 SURGERY — ESOPHAGOGASTRODUODENOSCOPY (EGD) WITH PROPOFOL
Anesthesia: General | Site: Throat

## 2021-12-27 MED ORDER — SODIUM CHLORIDE 0.9 % IV SOLN: INTRAVENOUS | Status: DC

## 2021-12-27 MED ORDER — GLYCOPYRROLATE 0.2 MG/ML IJ SOLN
INTRAMUSCULAR | Status: DC | PRN
  Administered 2021-12-27: .1 mg via INTRAVENOUS

## 2021-12-27 MED ORDER — PROPOFOL 10 MG/ML IV BOLUS
INTRAVENOUS | Status: DC | PRN
  Administered 2021-12-27: 25 mg via INTRAVENOUS
  Administered 2021-12-27 (×4): 50 mg via INTRAVENOUS

## 2021-12-27 MED ORDER — LACTATED RINGERS IV SOLN: INTRAVENOUS | Status: DC

## 2021-12-27 MED ORDER — LIDOCAINE HCL (CARDIAC) PF 100 MG/5ML IV SOSY
PREFILLED_SYRINGE | INTRAVENOUS | Status: DC | PRN
  Administered 2021-12-27: 80 mg via INTRAVENOUS

## 2021-12-27 MED ORDER — HYDROXYZINE HCL 10 MG/5ML PO SYRP: 25.0000 mg | ORAL_SOLUTION | Freq: Three times a day (TID) | ORAL | 0 refills | Status: DC | PRN

## 2021-12-27 MED ORDER — STERILE WATER FOR IRRIGATION IR SOLN
Status: DC | PRN
  Administered 2021-12-27: 50 mL

## 2021-12-27 SURGICAL SUPPLY — 8 items
BLOCK BITE 60FR ADLT L/F GRN (MISCELLANEOUS) ×1 IMPLANT
FORCEPS BIOP RAD 4 LRG CAP 4 (CUTTING FORCEPS) IMPLANT
GOWN CVR UNV OPN BCK APRN NK (MISCELLANEOUS) ×2 IMPLANT
GOWN ISOL THUMB LOOP REG UNIV (MISCELLANEOUS) ×2
KIT PRC NS LF DISP ENDO (KITS) ×1 IMPLANT
KIT PROCEDURE OLYMPUS (KITS) ×1
MANIFOLD NEPTUNE II (INSTRUMENTS) ×1 IMPLANT
WATER STERILE IRR 250ML POUR (IV SOLUTION) ×1 IMPLANT

## 2021-12-27 NOTE — Anesthesia Postprocedure Evaluation (Signed)
Anesthesia Post Note  Patient: Jack Hicks  Procedure(s) Performed: ESOPHAGOGASTRODUODENOSCOPY (EGD) WITH PROPOFOL (Throat)     Patient location during evaluation: PACU Anesthesia Type: General Level of consciousness: awake and alert Pain management: pain level controlled Vital Signs Assessment: post-procedure vital signs reviewed and stable Respiratory status: spontaneous breathing, nonlabored ventilation, respiratory function stable and patient connected to nasal cannula oxygen Cardiovascular status: blood pressure returned to baseline and stable Postop Assessment: no apparent nausea or vomiting Anesthetic complications: no   There were no known notable events for this encounter.  Stephanie Coup

## 2021-12-27 NOTE — Anesthesia Preprocedure Evaluation (Signed)
Anesthesia Evaluation  Patient identified by MRN, date of birth, ID band Patient awake    Reviewed: Allergy & Precautions, NPO status , Patient's Chart, lab work & pertinent test results  Airway Mallampati: IV  TM Distance: >3 FB Neck ROM: full  Mouth opening: Limited Mouth Opening  Dental  (+) Dental Advidsory Given, Teeth Intact   Pulmonary neg pulmonary ROS, neg shortness of breath, neg sleep apnea,    Pulmonary exam normal        Cardiovascular (-) angina(-) Past MI and (-) CABG negative cardio ROS Normal cardiovascular exam(-) Valvular Problems/Murmurs     Neuro/Psych PSYCHIATRIC DISORDERS negative neurological ROS     GI/Hepatic negative GI ROS, Neg liver ROS,   Endo/Other  negative endocrine ROS  Renal/GU negative Renal ROS  negative genitourinary   Musculoskeletal   Abdominal   Peds  Hematology negative hematology ROS (+)   Anesthesia Other Findings Past Medical History: No date: Allergy No date: Heart murmur     Comment:  as child No date: History of chicken pox No date: Low testosterone No date: Thyroid disease     Comment:  borderline Hypothyroidism   Past Surgical History: 2017: TISSUE GRAFT; Left  BMI    Body Mass Index: 25.22 kg/m      Reproductive/Obstetrics negative OB ROS                             Anesthesia Physical Anesthesia Plan  ASA: 2  Anesthesia Plan: General   Post-op Pain Management: Minimal or no pain anticipated   Induction: Intravenous  PONV Risk Score and Plan: 3 and Propofol infusion, TIVA and Ondansetron  Airway Management Planned: Nasal Cannula  Additional Equipment: None  Intra-op Plan:   Post-operative Plan:   Informed Consent: I have reviewed the patients History and Physical, chart, labs and discussed the procedure including the risks, benefits and alternatives for the proposed anesthesia with the patient or authorized  representative who has indicated his/her understanding and acceptance.     Dental advisory given  Plan Discussed with: CRNA and Surgeon  Anesthesia Plan Comments: (Discussed risks of anesthesia with patient, including possibility of difficulty with spontaneous ventilation under anesthesia necessitating airway intervention, PONV, and rare risks such as cardiac or respiratory or neurological events, and allergic reactions. Discussed the role of CRNA in patient's perioperative care. Patient understands.)        Anesthesia Quick Evaluation

## 2021-12-27 NOTE — H&P (Signed)
Arlyss Repress, MD [address]  [address]  [place], [place] [address]  Main: [phone]  Fax: [phone] Pager: [phone]  Primary Care Physician:  Lorre Munroe, NP Primary Gastroenterologist:  Dr. Arlyss Repress  Pre-Procedure History & Physical: HPI:  Jack Hicks is a 31 y.o. male is here for an endoscopy.   Past Medical History:  Diagnosis Date   Allergy    Heart murmur    as child   History of chicken pox    Low testosterone    Thyroid disease    borderline Hypothyroidism     Past Surgical History:  Procedure Laterality Date   TISSUE GRAFT Left 2017    Prior to Admission medications   Medication Sig Start Date End Date Taking? Authorizing Provider  alum & mag hydroxide-simeth (MAALOX PLUS) 400-400-40 MG/5ML suspension Take 10 mLs by mouth every 6 (six) hours as needed for indigestion.   Yes [provider]  clomiPHENE (CLOMID) 50 MG tablet Take 25 mg by mouth daily. 04/03/21  Yes [provider]  dicyclomine (BENTYL) 20 MG tablet Take 1 tablet (20 mg total) by mouth every 8 (eight) hours as needed for spasms (Abdominal cramping). 12/22/21  Yes Ward, Layla Maw, DO  famotidine (PEPCID) 20 MG tablet Take 1 tablet (20 mg total) by mouth 2 (two) times daily for 10 days. 12/22/21 01/01/22 Yes Poggi, Herb Grays, PA-C  HYDROcodone-acetaminophen (HYCET) 7.5-325 mg/15 ml solution Take 10 mLs by mouth 4 (four) times daily as needed for up to 3 days for moderate pain. 12/25/21 12/28/21 Yes Merwyn Katos, MD  hydrOXYzine (ATARAX) 10 MG/5ML syrup Take 12.5 mLs (25 mg total) by mouth 3 (three) times daily as needed. 12/27/21  Yes Baity, Salvadore Oxford, NP  lidocaine (XYLOCAINE) 2 % solution Use as directed 15 mLs in the mouth or throat every 6 (six) hours as needed for mouth pain. 12/22/21  Yes Poggi, Herb Grays, PA-C  omeprazole (PRILOSEC OTC) 20 MG tablet Take 2 tablets (40 mg total) by mouth daily for 28 days. 12/22/21 01/19/22 Yes Poggi, Eileen Stanford E, PA-C  ondansetron  (ZOFRAN-ODT) 4 MG disintegrating tablet Take 1 tablet (4 mg total) by mouth every 6 (six) hours as needed for nausea or vomiting. 12/25/21  Yes Ward, Layla Maw, DO    Allergies as of 12/23/2021   (No Known Allergies)    Family History  Problem Relation Age of Onset   Hyperlipidemia Mother    Hypertension Mother    Hyperlipidemia Father    Hypertension Father    Healthy Sister    Hyperlipidemia Maternal Grandmother    Hypertension Maternal Grandmother    Prostate cancer Maternal Grandfather    Hyperlipidemia Maternal Grandfather    Stroke Maternal Grandfather    Hypertension Maternal Grandfather    Hyperlipidemia Paternal Grandmother    Hypertension Paternal Grandmother    Arthritis Paternal Grandfather    Hyperlipidemia Paternal Grandfather    Hypertension Paternal Grandfather     Social History   Socioeconomic History   Marital status: Married    Spouse name: Not on file   Number of children: Not on file   Years of education: Not on file   Highest education level: Not on file  Occupational History   Not on file  Tobacco Use   Smoking status: Never   Smokeless tobacco: Never  Vaping Use   Vaping Use: Never used  Substance and Sexual Activity   Alcohol use: Yes    Alcohol/week: 1.0 standard  drink of alcohol    Types: 1 Standard drinks or equivalent per week   Drug use: No   Sexual activity: Yes  Other Topics Concern   Not on file  Social History Narrative   Not on file   Social Determinants of Health   Financial Resource Strain: Not on file  Food Insecurity: Not on file  Transportation Needs: Not on file  Physical Activity: Not on file  Stress: Not on file  Social Connections: Not on file  Intimate Partner Violence: Not on file    Review of Systems: See HPI, otherwise negative ROS  Physical Exam: BP 125/79   Pulse 79   Temp 98.8 F (37.1 C) (Temporal)   Resp 17   Ht 5\' 7"  (1.702 m)   Wt 73 kg   SpO2 100%   BMI 25.22 kg/m  General:   Alert,   pleasant and cooperative in NAD Head:  Normocephalic and atraumatic. Neck:  Supple; no masses or thyromegaly. Lungs:  Clear throughout to auscultation.    Heart:  Regular rate and rhythm. Abdomen:  Soft, nontender and nondistended. Normal bowel sounds, without guarding, and without rebound.   Neurologic:  Alert and  oriented x4;  grossly normal neurologically.  Impression/Plan: is here for an endoscopy to be performed for dysphagia   Risks, benefits, limitations, and alternatives regarding  endoscopy have been reviewed with the patient.  Questions have been answered.  All parties agreeable.   Juliette Mangle, MD  12/27/2021, 10:54 AM

## 2021-12-27 NOTE — Transfer of Care (Signed)
Immediate Anesthesia Transfer of Care Note  Patient: Jack Hicks  Procedure(s) Performed: ESOPHAGOGASTRODUODENOSCOPY (EGD) WITH PROPOFOL (Throat)  Patient Location: PACU  Anesthesia Type: General  Level of Consciousness: awake, alert  and patient cooperative  Airway and Oxygen Therapy: Patient Spontanous Breathing and Patient connected to supplemental oxygen  Post-op Assessment: Post-op Vital signs reviewed, Patient's Cardiovascular Status Stable, Respiratory Function Stable, Patent Airway and No signs of Nausea or vomiting  Post-op Vital Signs: Reviewed and stable  Complications: There were no known notable events for this encounter.

## 2021-12-27 NOTE — Op Note (Signed)
[facility] Patient Name: Jack Hicks Procedure Date: 12/27/2021 11:38 AM MRN: [id number] Account #: [id number] Date of Birth: [birth year] Admit Type: Outpatient Age: 31 Room: [facility] OR ROOM 01 Gender: Male Note Status: Finalized Instrument Name: [id number] Procedure:             Upper GI endoscopy Indications:           Generalized abdominal pain, Dysphagia Providers:             Toney Reil MD, MD Referring MD:          Lorre Munroe (Referring MD) Medicines:             General Anesthesia Complications:         No immediate complications. Estimated blood loss: None. Procedure:             Pre-Anesthesia Assessment:                        - Prior to the procedure, a History and Physical was                         performed, and patient medications and allergies were                         reviewed. The patient is competent. The risks and                         benefits of the procedure and the sedation options and                         risks were discussed with the patient. All questions                         were answered and informed consent was obtained.                         Patient identification and proposed procedure were                         verified by the physician, the nurse, the                         anesthesiologist, the anesthetist and the technician                         in the pre-procedure area in the procedure room in the                         endoscopy suite. Mental Status Examination: alert and                         oriented. Airway Examination: normal oropharyngeal                         airway and neck mobility. Respiratory Examination:                         clear to auscultation. CV Examination: normal.  Prophylactic Antibiotics: The patient does not require                         prophylactic antibiotics. Prior Anticoagulants: The                         patient has  taken no previous anticoagulant or                         antiplatelet agents. ASA Grade Assessment: II - A                         patient with mild systemic disease. After reviewing                         the risks and benefits, the patient was deemed in                         satisfactory condition to undergo the procedure. The                         anesthesia plan was to use general anesthesia.                         Immediately prior to administration of medications,                         the patient was re-assessed for adequacy to receive                         sedatives. The heart rate, respiratory rate, oxygen                         saturations, blood pressure, adequacy of pulmonary                         ventilation, and response to care were monitored                         throughout the procedure. The physical status of the                         patient was re-assessed after the procedure.                        After obtaining informed consent, the endoscope was                         passed under direct vision. Throughout the procedure,                         the patient's blood pressure, pulse, and oxygen                         saturations were monitored continuously. The Endoscope                         was introduced through the mouth, and advanced to the  second part of duodenum. The upper GI endoscopy was                         accomplished without difficulty. The patient tolerated                         the procedure well. Findings:      The duodenal bulb and second portion of the duodenum were normal.       Biopsies were taken with a cold forceps for histology.      The entire examined stomach was normal. Biopsies were taken with a cold       forceps for histology.      The cardia and gastric fundus were normal on retroflexion.      Esophagogastric landmarks were identified: the gastroesophageal junction       was found at 35  cm from the incisors.      Mucosal changes including longitudinal markings and vertical lines were       found in the distal esophagus. Esophageal findings were graded using the       Eosinophilic Esophagitis Endoscopic Reference Score (EoE-EREFS) as:       Edema Grade 1 Present (decreased clarity or absence of vascular       markings), Rings Grade 0 None (no ridges or rings seen), Exudates Grade       0 None (no white lesions seen), Furrows Grade 1 Present (vertical lines       with or without visible depth) and Stricture none (no stricture found).       Biopsies were obtained from the proximal and distal esophagus with cold       forceps for histology of suspected eosinophilic esophagitis. Estimated       blood loss: none. Impression:            - Normal duodenal bulb and second portion of the                         duodenum. Biopsied.                        - Normal stomach. Biopsied.                        - Esophagogastric landmarks identified.                        - Esophageal mucosal changes suspicious for                         eosinophilic esophagitis. Biopsied. Recommendation:        - Discharge patient to home (with parent).                        - Resume previous diet today.                        - Continue present medications.                        - Await pathology results.                        - Return to my office  as previously scheduled. Procedure Code(s):     --- Professional ---                        [phone], Esophagogastroduodenoscopy, flexible,                         transoral; with biopsy, single or multiple Diagnosis Code(s):     --- Professional ---                        K22.8, Other specified diseases of esophagus                        R10.84, Generalized abdominal pain                        R13.10, Dysphagia, unspecified CPT copyright 2019 [employer] Medical Association. All rights reserved. The codes documented in this report are preliminary and upon  coder review may  be revised to meet current compliance requirements. Dr. Libby Maw Toney Reil MD, MD 12/27/2021 12:03:09 PM This report has been signed electronically. Number of Addenda: 0 Note Initiated On: 12/27/2021 11:38 AM Total Procedure Duration: 0 hours 10 minutes 30 seconds  Estimated Blood Loss:  Estimated blood loss: none.      [facility]

## 2021-12-27 NOTE — Telephone Encounter (Signed)
Patient states he is having abdominal pain that is worse at night. She states the ER gave him hydrocodone at night. He states he looked up on goggle and there is a interaction between the Hydrocodone and Dicyclomine. Asked Dr. Allegra Lai she states the hydrocodone and and dicyclomine can caused constipation. Informed patient and he verbalized understanding

## 2021-12-28 ENCOUNTER — Encounter: Payer: Self-pay | Admitting: Gastroenterology

## 2021-12-29 ENCOUNTER — Ambulatory Visit: Payer: BC Managed Care – PPO | Admitting: Internal Medicine

## 2021-12-29 LAB — SURGICAL PATHOLOGY

## 2021-12-30 ENCOUNTER — Telehealth: Payer: Self-pay

## 2021-12-30 MED ORDER — OMEPRAZOLE 40 MG PO CPDR: 40.0000 mg | DELAYED_RELEASE_CAPSULE | Freq: Two times a day (BID) | ORAL | 1 refills | Status: DC

## 2021-12-30 NOTE — Telephone Encounter (Signed)
-----   Message from Toney Reil, MD sent at 12/30/2021  8:15 AM EDT ----- I need to see him next Wednesday to discuss about pathology findings from recent EGD. He has diagnosis of Eosinophilic esophagitis, recommend omeprazole 40mg  PO BID before meals  RV

## 2021-12-30 NOTE — Telephone Encounter (Signed)
Patient verbalized understanding and made follow up appointment for Wednesday. Sent medication to the pharmacy

## 2022-01-04 ENCOUNTER — Other Ambulatory Visit: Payer: Self-pay

## 2022-01-04 ENCOUNTER — Encounter: Payer: Self-pay | Admitting: Gastroenterology

## 2022-01-04 ENCOUNTER — Ambulatory Visit: Payer: BC Managed Care – PPO | Admitting: Gastroenterology

## 2022-01-04 VITALS — BP 117/79 | HR 89 | Temp 97.9°F | Ht 67.0 in | Wt 164.4 lb

## 2022-01-04 DIAGNOSIS — K200 Eosinophilic esophagitis: Secondary | ICD-10-CM

## 2022-01-04 MED ORDER — OMEPRAZOLE 40 MG PO CPDR: 40.0000 mg | DELAYED_RELEASE_CAPSULE | Freq: Two times a day (BID) | ORAL | 2 refills | Status: DC

## 2022-01-04 NOTE — Progress Notes (Signed)
Arlyss Repress, MD [address]  [address]  [place], [place] [address]  Main: [phone]  Fax: [phone]    Gastroenterology Consultation  Referring Provider:     Lorre Munroe, NP Primary Care Physician:  Lorre Munroe, NP Primary Gastroenterologist:  Dr. Arlyss Repress Reason for Consultation:     Dysphagia, EOE        HPI:   Jack Hicks is a 31 y.o. male referred by Dr. Lorre Munroe, NP  for consultation & management of dysphagia.  Patient reports difficulty swallowing to solids since age of 89.  He has been experiencing intermittent episodes of food getting stuck in his throat, to various solid foods such as vegetables, meat, rice.  He was recently in the ER about a month ago, the food bolus spontaneously passed after drinking Coke.  Patient denies any heartburn. He does not take any medications currently.  Patient does not smoke or drink alcohol.  He denies any other GI symptoms.  He does report severe seasonal allergies, takes Zyrtec and clomiphene regularly. Patient denies any food intolerances/allergies. History of TMJ  Follow-up visit 01/04/2022 Patient is here for follow-up of dysphagia.  He underwent EGD on 12/27/2021 which revealed esophageal mucosal changes suspicious for eosinophilic esophagitis and confirmed on pathology.  Patient is started on omeprazole 40 mg twice daily before breakfast and he reports significant improvement in his symptoms.  He does not have difficulty swallowing as well.  He is no longer requiring dicyclomine, Vicodin or any other medications.  NSAIDs: None  Antiplts/Anticoagulants/Anti thrombotics: None  GI Procedures:  Upper endoscopy 12/27/2021 - Normal duodenal bulb and second portion of the duodenum. Biopsied. - Normal stomach. Biopsied. - Esophagogastric landmarks identified. - Esophageal mucosal changes suspicious for eosinophilic esophagitis. Biopsied.  DIAGNOSIS:  A.  DUODENUM; BIOPSY:  - UNREMARKABLE DUODENAL  MUCOSA.  - NEGATIVE FOR DYSPLASIA AND MALIGNANCY.   B.  STOMACH, RANDOM; BIOPSY:  - UNREMARKABLE ANTRAL AND OXYNTIC MUCOSA.  - NEGATIVE FOR H. PYLORI, INTESTINAL METAPLASIA, DYSPLASIA, AND  MALIGNANCY.   C.  ESOPHAGUS, DISTAL; BIOPSY:  - SQUAMOUS MUCOSA WITH INTRAEPITHELIAL EOSINOPHILS (UP TO 55 EOSINOPHILS  IN A HIGH-POWER FIELD) INVOLVING ALL BIOPSY FRAGMENTS, SEE COMMENT.  - NEGATIVE FOR DYSPLASIA AND MALIGNANCY.   D.  ESOPHAGUS, PROXIMAL; BIOPSY:  - SQUAMOUS MUCOSA WITH INTRAEPITHELIAL EOSINOPHILS (UP TO 30 EOSINOPHILS  IN A HIGH-POWER FIELD) INVOLVING A MAJORITY OF THE BIOPSY FRAGMENTS, SEE  COMMENT.  - NEGATIVE FOR DYSPLASIA AND MALIGNANCY.   Past Medical History:  Diagnosis Date   Allergy    Heart murmur    as child   History of chicken pox    Low testosterone    Thyroid disease    borderline Hypothyroidism     Past Surgical History:  Procedure Laterality Date   ESOPHAGOGASTRODUODENOSCOPY (EGD) WITH PROPOFOL N/A 12/27/2021   Procedure: ESOPHAGOGASTRODUODENOSCOPY (EGD) WITH PROPOFOL;  Surgeon: Toney Reil, MD;  Location: [facility];  Service: Endoscopy;  Laterality: N/A;   TISSUE GRAFT Left 2017    Current Outpatient Medications:    clomiPHENE (CLOMID) 50 MG tablet, Take 25 mg by mouth daily., Disp: , Rfl:    dicyclomine (BENTYL) 20 MG tablet, Take 1 tablet (20 mg total) by mouth every 8 (eight) hours as needed for spasms (Abdominal cramping)., Disp: 15 tablet, Rfl: 0   hydrOXYzine (ATARAX) 10 MG/5ML syrup, Take 12.5 mLs (25 mg total) by mouth 3 (three) times daily as needed., Disp: 240 mL, Rfl:  0   omeprazole (PRILOSEC) 40 MG capsule, Take 1 capsule (40 mg total) by mouth 2 (two) times daily before a meal., Disp: 60 capsule, Rfl: 2   alum & mag hydroxide-simeth (MAALOX PLUS) 400-400-40 MG/5ML suspension, Take 10 mLs by mouth every 6 (six) hours as needed for indigestion. (Patient not taking: Reported on 01/04/2022), Disp: , Rfl:     HYDROcodone-acetaminophen (NORCO/VICODIN) 5-325 MG tablet, Take by mouth. (Patient not taking: Reported on 01/04/2022), Disp: , Rfl:    lidocaine (XYLOCAINE) 2 % solution, Use as directed 15 mLs in the mouth or throat every 6 (six) hours as needed for mouth pain. (Patient not taking: Reported on 01/04/2022), Disp: 50 mL, Rfl: 0   ondansetron (ZOFRAN-ODT) 4 MG disintegrating tablet, Take 1 tablet (4 mg total) by mouth every 6 (six) hours as needed for nausea or vomiting. (Patient not taking: Reported on 01/04/2022), Disp: 15 tablet, Rfl: 0   Family History  Problem Relation Age of Onset   Hyperlipidemia Mother    Hypertension Mother    Hyperlipidemia Father    Hypertension Father    Healthy Sister    Hyperlipidemia Maternal Grandmother    Hypertension Maternal Grandmother    Prostate cancer Maternal Grandfather    Hyperlipidemia Maternal Grandfather    Stroke Maternal Grandfather    Hypertension Maternal Grandfather    Hyperlipidemia Paternal Grandmother    Hypertension Paternal Grandmother    Arthritis Paternal Grandfather    Hyperlipidemia Paternal Grandfather    Hypertension Paternal Grandfather      Social History   Tobacco Use   Smoking status: Never   Smokeless tobacco: Never  Vaping Use   Vaping Use: Never used  Substance Use Topics   Alcohol use: Yes    Alcohol/week: 1.0 standard drink of alcohol    Types: 1 Standard drinks or equivalent per week   Drug use: No    Allergies as of 01/04/2022   (No Known Allergies)    Review of Systems:    All systems reviewed and negative except where noted in HPI.   Physical Exam:  BP 117/79 (BP Location: Left Arm, Patient Position: Sitting, Cuff Size: Normal)   Pulse 89   Temp 97.9 F (36.6 C) (Oral)   Ht 5\' 7"  (1.702 m)   Wt 164 lb 6 oz (74.6 kg)   BMI 25.74 kg/m  No LMP for male patient.  General:   Alert,  Well-developed, well-nourished, pleasant and cooperative in NAD Head:  Normocephalic and atraumatic. Eyes:   Sclera clear, no icterus.   Conjunctiva pink. Ears:  Normal auditory acuity. Nose:  No deformity, discharge, or lesions. Mouth:  No deformity or lesions,oropharynx pink & moist. Neck:  Supple; no masses or thyromegaly. Lungs:  Respirations even and unlabored.  Clear throughout to auscultation.   No wheezes, crackles, or rhonchi. No acute distress. Heart:  Regular rate and rhythm; no murmurs, clicks, rubs, or gallops. Abdomen:  Normal bowel sounds. Soft, non-tender and non-distended without masses, hepatosplenomegaly or hernias noted.  No guarding or rebound tenderness.   Rectal: Not performed Msk:  Symmetrical without gross deformities. Good, equal movement & strength bilaterally. Pulses:  Normal pulses noted. Extremities:  No clubbing or edema.  No cyanosis. Neurologic:  Alert and oriented x3;  grossly normal neurologically. Skin:  Intact without significant lesions or rashes. No jaundice. Psych:  Alert and cooperative. Normal mood and affect.  Imaging Studies: X-ray esophagus 06/16/2010 IMPRESSION:   1.  Normal esophageal motility.  2.  Small sliding-type hiatal  hernia.  3.  Lower esophageal mucosal ring.    Assessment and Plan:   Jack Hicks is a 31 y.o. pleasant Caucasian male with chronic symptoms of dysphagia to solids, episodes of food impaction that spontaneously passed, EGD confirmed eosinophilic esophagitis  Eosinophilic esophagitis, biopsy-proven Started on omeprazole 40 mg p.o. twice daily before meals and symptoms are under control Recommend repeat EGD in 3 months with proximal and distal esophageal biopsies to assess response to PPI Discussed about full food and 6 food illumination diet, information provided   I have discussed alternative options, risks & benefits,  which include, but are not limited to, bleeding, infection, perforation,respiratory complication & drug reaction.  The patient agrees with this plan & written consent will be obtained.    Follow up in  3 to 4 months   Cephas Darby, MD

## 2022-01-06 ENCOUNTER — Encounter: Payer: Self-pay | Admitting: Internal Medicine

## 2022-01-06 ENCOUNTER — Ambulatory Visit: Payer: BC Managed Care – PPO | Admitting: Internal Medicine

## 2022-01-06 VITALS — BP 118/80 | HR 81 | Temp 96.9°F | Wt 165.0 lb

## 2022-01-06 DIAGNOSIS — K449 Diaphragmatic hernia without obstruction or gangrene: Secondary | ICD-10-CM

## 2022-01-06 DIAGNOSIS — R1084 Generalized abdominal pain: Secondary | ICD-10-CM | POA: Diagnosis not present

## 2022-01-06 DIAGNOSIS — K200 Eosinophilic esophagitis: Secondary | ICD-10-CM | POA: Diagnosis not present

## 2022-01-06 DIAGNOSIS — L659 Nonscarring hair loss, unspecified: Secondary | ICD-10-CM

## 2022-01-06 DIAGNOSIS — R140 Abdominal distension (gaseous): Secondary | ICD-10-CM | POA: Diagnosis not present

## 2022-01-06 NOTE — Patient Instructions (Signed)
Esophagitis  Esophagitis is inflammation of the esophagus. The esophagus is the tube that carries food from the mouth to the stomach. Esophagitis can cause soreness or pain in the esophagus. This condition can make it difficult and painful to swallow. What are the causes? Most causes of esophagitis are not serious. Common causes of this condition include: Gastroesophageal reflux disease (GERD). This is when stomach contents move back up into the esophagus (reflux). Repeated vomiting. An allergic reaction, especially caused by food allergies (eosinophilic esophagitis). Injury to the esophagus by swallowing large pills with or without water, or swallowing certain types of medicines. Swallowing harmful chemicals, such as household cleaning products. Drinking a lot of alcohol. An infection of the esophagus. This most often occurs in people who have a weakened immune system. Radiation or chemotherapy treatment for cancer. Certain diseases such as sarcoidosis, Crohn's disease, and scleroderma. What are the signs or symptoms? Symptoms of this condition include: Difficult or painful swallowing. Pain with swallowing acidic liquids, such as citrus juices. You may also have pain when you burp. Chest pain and difficulty breathing. Nausea and vomiting. Pain in the abdomen. Weight loss. Ulcers in the mouth and white patches in the mouth (candidiasis). Fever. Coughing up blood or vomiting blood. Stool that is black, tarry, or bright red. How is this diagnosed? This condition may be diagnosed based on your medical history and a physical exam. You may also have other tests, including: A test to examine your esophagus and stomach with a small flexible tube with a camera (endoscopy). A test that measures the acidity level in your esophagus. A test that measures how much pressure is on your esophagus. A barium swallow or modified barium swallow to show the shape, size, and functioning of your  esophagus. Allergy tests. How is this treated? Treatment for this condition depends on the cause of your esophagitis. In some cases, steroids or other medicines may be given to help relieve your symptoms or to treat the underlying cause of your condition. You may have to make some lifestyle changes, such as: Avoiding alcohol. Quitting any products that contain nicotine or tobacco. These products include cigarettes, chewing tobacco, and vaping devices, such as e-cigarettes. If you need help quitting, ask your health care provider. Changing your diet. Exercising. Changing your sleep habits and your sleep environment. Follow these instructions at home: Medicines Take over-the-counter and prescription medicines only as told by your health care provider. Do not take aspirin, ibuprofen, or other NSAIDs unless your health care provider told you to do so. If you have trouble taking pills: Use a pill splitter to decrease the size of the pill. This will decrease the chance of the pill getting stuck or injuring your esophagus. Drink water after you take a pill. Eating and drinking  Avoid foods and drinks that seem to make your symptoms worse. Follow a diet as recommended by your health care provider. This may involve avoiding foods and drinks such as: Coffee and tea, with or without caffeine. Drinks that contain alcohol. Energy drinks and sports drinks. Carbonated drinks or sodas. Chocolate and cocoa. Peppermint and mint flavorings. Garlic and onions. Horseradish. Spicy and acidic foods, including peppers, chili powder, curry powder, vinegar, hot sauces, and barbecue sauce. Citrus fruit juices and citrus fruits, such as oranges, lemons, and limes. Tomato-based foods, such as red sauce, chili, salsa, and pizza with red sauce. Fried and fatty foods, such as donuts, french fries, potato chips, and high-fat dressings. High-fat meats, such as hot dogs and fatty   cuts of red and white meats, such as  rib eye steak, sausage, ham, and bacon. High-fat dairy items, such as whole milk, butter, and cream cheese. Lifestyle Eat small, frequent meals instead of large meals. Avoid drinking large amounts of liquid with your meals. Avoid eating meals during the 2-3 hours before bedtime. Avoid lying down right after you eat. Do not exercise right after you eat. Do not use any products that contain nicotine or tobacco. These products include cigarettes, chewing tobacco, and vaping devices, such as e-cigarettes. If you need help quitting, ask your health care provider. General instructions  Pay attention to any changes in your symptoms. Let your health care provider know about them. Wear loose-fitting clothing. Do not wear anything tight around your waist that causes pressure on your abdomen. Raise (elevate) the head of your bed about 6 inches (15 cm). You may need to use a wedge to do this. Try relaxation strategies such as yoga, deep breathing, or meditation to manage stress. If you need help reducing stress, ask your health care provider. If you are overweight, reduce your weight to an amount that is healthy for you. Ask your health care provider for guidance about a safe weight loss goal. Keep all follow-up visits. This is important. Contact a health care provider if: You have new symptoms. You have unexplained weight loss. You have difficulty swallowing, or it hurts to swallow. You have wheezing or a cough that does not go away. Your symptoms do not improve with treatment. You have frequent heartburn for more than two weeks. Get help right away if: You have sudden severe pain in your arms, neck, jaw, teeth, or back. You suddenly feel sweaty, dizzy, or light-headed. You have chest pain or shortness of breath. You vomit and the vomit is green, yellow, or black, or it looks like blood or coffee grounds. Your stool is red, bloody, or black. You have a fever. You cannot swallow, drink, or  eat. These symptoms may represent a serious problem that is an emergency. Do not wait to see if the symptoms will go away. Get medical help right away. Call your local emergency services (911 in the [place] Do not drive yourself to the hospital. Summary Esophagitis is inflammation of the esophagus. Most causes of esophagitis are not serious. Follow your health care provider's instructions about eating and drinking. Contact a health care provider if you have new symptoms, have weight loss, or coughing that does not stop. Get help right away if you have severe pain in the arms, neck, jaw, teeth, or back, or if you have chest pain, shortness of breath, or fever. This information is not intended to replace advice given to you by your health care provider. Make sure you discuss any questions you have with your health care provider. Document Revised: 10/13/2019 Document Reviewed: 10/13/2019 Elsevier Patient Education  2023 Elsevier Inc.  

## 2022-01-06 NOTE — Progress Notes (Signed)
Subjective:    Patient ID: Jack Hicks, male    DOB: 05-Dec-1990, 31 y.o.   MRN: [id number]  HPI  Patient presents to the clinic today for multiple urgent care and ER follow-up.  He initially did to the urgent care 9/6 with complaint of abdominal pain.  He was diagnosed with gas and bloating.  He was given prescriptions for Mylanta, Pepto-Bismol and Maalox.  He presented to the ER later in the day on 9/6 but left without being seen.  He went back to the ER the next day 9/7.  Labs were unremarkable.  ECG did not show any acute findings.  CT abdomen/pelvis showed:  IMPRESSION: 1. No acute finding. 2. Small bilateral renal calculi. 3. Chronic L5 pars defects.   He was treated with IV fluids and Toradol.  He was discharged with prescriptions for Bentyl and Zofran.  He presented back to the ER 9/7 for the same.  Repeat labs were unremarkable.  He was given a GI cocktail.  Right upper quadrant ultrasound did not show any acute findings.  GI was consulted and was advised to start on a PPI and schedule an outpatient upper GI.  He was discharged with prescriptions for Omeprazole and Famotidine.  He presented back to the ER 9/10 but left without being seen.  He went back to the ER later in the day on 9/10.  His ECG was unchanged.  They did not repeat his labs or any imaging.  He was treated with IV fluids and pain meds.  He was given a prescription for Hydrocodone and advised to follow-up with GI as an outpatient.  He subsequently underwent a EGD with Dr. Allegra Lai on 9/12.  Pathology revealed eosinophilic esophagitis.  He followed up with GI 9/20 for an office visit.  His Omeprazole was increased to 40 mg twice daily.  Since that time, he denies difficulty swallowing, nausea, vomiting, reflux, constipation, diarrhea or blood in his stool. He plans to follow up with GI in 3 months for a repeat upper GI.  He also reports some hair thinning.  He has noticed this along with an itchy scalp over the last year.  He  is concerned that this may be related to Clomid.  He follows up with his endocrinologist in December 2023.  Review of Systems     Past Medical History:  Diagnosis Date   Allergy    Heart murmur    as child   History of chicken pox    Low testosterone    Thyroid disease    borderline Hypothyroidism     Current Outpatient Medications  Medication Sig Dispense Refill   alum & mag hydroxide-simeth (MAALOX PLUS) 400-400-40 MG/5ML suspension Take 10 mLs by mouth every 6 (six) hours as needed for indigestion. (Patient not taking: Reported on 01/04/2022)     clomiPHENE (CLOMID) 50 MG tablet Take 25 mg by mouth daily.     dicyclomine (BENTYL) 20 MG tablet Take 1 tablet (20 mg total) by mouth every 8 (eight) hours as needed for spasms (Abdominal cramping). 15 tablet 0   HYDROcodone-acetaminophen (NORCO/VICODIN) 5-325 MG tablet Take by mouth. (Patient not taking: Reported on 01/04/2022)     hydrOXYzine (ATARAX) 10 MG/5ML syrup Take 12.5 mLs (25 mg total) by mouth 3 (three) times daily as needed. 240 mL 0   lidocaine (XYLOCAINE) 2 % solution Use as directed 15 mLs in the mouth or throat every 6 (six) hours as needed for mouth pain. (Patient not taking: Reported  on 01/04/2022) 50 mL 0   omeprazole (PRILOSEC) 40 MG capsule Take 1 capsule (40 mg total) by mouth 2 (two) times daily before a meal. 60 capsule 2   ondansetron (ZOFRAN-ODT) 4 MG disintegrating tablet Take 1 tablet (4 mg total) by mouth every 6 (six) hours as needed for nausea or vomiting. (Patient not taking: Reported on 01/04/2022) 15 tablet 0   No current facility-administered medications for this visit.    No Known Allergies  Family History  Problem Relation Age of Onset   Hyperlipidemia Mother    Hypertension Mother    Hyperlipidemia Father    Hypertension Father    Healthy Sister    Hyperlipidemia Maternal Grandmother    Hypertension Maternal Grandmother    Prostate cancer Maternal Grandfather    Hyperlipidemia Maternal  Grandfather    Stroke Maternal Grandfather    Hypertension Maternal Grandfather    Hyperlipidemia Paternal Grandmother    Hypertension Paternal Grandmother    Arthritis Paternal Grandfather    Hyperlipidemia Paternal Grandfather    Hypertension Paternal Grandfather     Social History   Socioeconomic History   Marital status: Married    Spouse name: Not on file   Number of children: Not on file   Years of education: Not on file   Highest education level: Not on file  Occupational History   Not on file  Tobacco Use   Smoking status: Never   Smokeless tobacco: Never  Vaping Use   Vaping Use: Never used  Substance and Sexual Activity   Alcohol use: Yes    Alcohol/week: 1.0 standard drink of alcohol    Types: 1 Standard drinks or equivalent per week   Drug use: No   Sexual activity: Yes  Other Topics Concern   Not on file  Social History Narrative   Not on file   Social Determinants of Health   Financial Resource Strain: Not on file  Food Insecurity: Not on file  Transportation Needs: Not on file  Physical Activity: Not on file  Stress: Not on file  Social Connections: Not on file  Intimate Partner Violence: Not on file     Constitutional: Denies fever, malaise, fatigue, headache or abrupt weight changes.  Respiratory: Denies difficulty breathing, shortness of breath, cough or sputum production.   Cardiovascular: Denies chest pain, chest tightness, palpitations or swelling in the hands or feet.  Gastrointestinal: Denies abdominal pain, bloating, constipation, diarrhea or blood in the stool.  GU: Denies urgency, frequency, pain with urination, burning sensation, blood in urine, odor or discharge.  Skin: Patient reports hair thinning.  Denies redness, rashes, lesions or ulcercations.    No other specific complaints in a complete review of systems (except as listed in HPI above).  Objective:   Physical Exam  BP 118/80 (BP Location: Left Arm, Patient Position:  Sitting, Cuff Size: Normal)   Pulse 81   Temp (!) 96.9 F (36.1 C) (Temporal)   Wt 165 lb (74.8 kg)   SpO2 98%   BMI 25.84 kg/m   Wt Readings from Last 3 Encounters:  01/04/22 164 lb 6 oz (74.6 kg)  12/27/21 161 lb (73 kg)  12/25/21 158 lb (71.7 kg)    General: Appears his stated age, weight, in NAD. Skin: Warm, dry and intact.  No patches of hair loss noted.  No rashes noted of scalp. Cardiovascular: Normal rate and rhythm. S1,S2 noted.  No murmur, rubs or gallops noted.  Pulmonary/Chest: Normal effort and positive vesicular breath sounds. No respiratory  distress. No wheezes, rales or ronchi noted.  Abdomen: Soft and nontender. Normal bowel sounds. No distention or masses noted. Liver, spleen and kidneys non palpable. Neurological: Alert and oriented.  BMET    Component Value Date/Time   NA 137 12/25/2021 0422   K 3.8 12/25/2021 0422   CL 99 12/25/2021 0422   CO2 28 12/25/2021 0422   GLUCOSE 117 (H) 12/25/2021 0422   BUN 20 12/25/2021 0422   CREATININE 1.03 12/25/2021 0422   CREATININE 1.08 11/15/2021 1114   CALCIUM 9.1 12/25/2021 0422   GFRNONAA >60 12/25/2021 0422   GFRAA >60 04/25/2019 0817    Lipid Panel     Component Value Date/Time   CHOL 178 11/15/2021 1114   TRIG 106 11/15/2021 1114   HDL 52 11/15/2021 1114   CHOLHDL 3.4 11/15/2021 1114   VLDL 19.4 07/03/2019 1204   LDLCALC 106 (H) 11/15/2021 1114    CBC    Component Value Date/Time   WBC 6.1 12/25/2021 0422   RBC 5.50 12/25/2021 0422   HGB 16.5 12/25/2021 0422   HCT 47.9 12/25/2021 0422   PLT 268 12/25/2021 0422   MCV 87.1 12/25/2021 0422   MCH 30.0 12/25/2021 0422   MCHC 34.4 12/25/2021 0422   RDW 12.5 12/25/2021 0422   LYMPHSABS 1.8 12/22/2021 1820   MONOABS 0.6 12/22/2021 1820   EOSABS 0.0 12/22/2021 1820   BASOSABS 0.0 12/22/2021 1820    Hgb A1C Lab Results  Component Value Date   HGBA1C 5.5 09/22/2020            Assessment & Plan:   ER Follow Up for Gassiness, Generalized  Abdominal Pain, Hiatal Hernia and Eosinophilic Esophagitis:  Multiple urgent care, ER and GI notes reviewed He will continue Omeprazole 40 mg twice daily as prescribed by GI He will consume a bland diet per GI recommendation He will follow-up with GI as previously scheduled  Hair Thinning:  Discussed use of Rogaine versus oral medication for treatment of this however he does not feel like it is that bad and I am in agreement with him at this time Advised him to discuss this with endocrinology at his next follow-up  RTC in 5 months for follow-up of chronic conditions Nicki Reaper, NP

## 2022-01-09 ENCOUNTER — Other Ambulatory Visit: Payer: Self-pay

## 2022-01-09 DIAGNOSIS — K200 Eosinophilic esophagitis: Secondary | ICD-10-CM

## 2022-01-17 ENCOUNTER — Ambulatory Visit: Payer: BC Managed Care – PPO | Admitting: Gastroenterology

## 2022-02-14 ENCOUNTER — Telehealth: Payer: Self-pay

## 2022-02-14 NOTE — Telephone Encounter (Signed)
Called patient and he states he is going to be on vacation on 03/21/2022 and wants to know if he can move to 03/24/2022. Informed him yes we could get him change to that day. Called endo and talk to penny and she would get patient moved. Sent new instructions to patient with new date

## 2022-02-14 NOTE — Telephone Encounter (Signed)
Patient left a voicemail because he wants to reschedule the EGD that is schedule for 03/21/2022

## 2022-03-23 ENCOUNTER — Telehealth: Payer: Self-pay

## 2022-03-23 DIAGNOSIS — E291 Testicular hypofunction: Secondary | ICD-10-CM | POA: Diagnosis not present

## 2022-03-23 DIAGNOSIS — R7989 Other specified abnormal findings of blood chemistry: Secondary | ICD-10-CM | POA: Diagnosis not present

## 2022-03-23 NOTE — Telephone Encounter (Signed)
Dondra Spry from ENDO called and states that patient states he called our office on Friday to cancel EGD. Called patient and patient states that he called on Friday morning and said he talk to [place] and she said she would sent a message to the staff to get the procedure cancel. He states he just do not understand why people do not do what they say. He said he better not get charge 100 dollars for canceling today. Informed patient he would not. Asked patient if he wanted to reschedule the procedure and he said not at this time

## 2022-03-24 ENCOUNTER — Encounter: Admission: RE | Payer: Self-pay | Source: Home / Self Care

## 2022-03-24 ENCOUNTER — Ambulatory Visit
Admission: RE | Admit: 2022-03-24 | Payer: BC Managed Care – PPO | Source: Home / Self Care | Admitting: Gastroenterology

## 2022-03-24 SURGERY — ESOPHAGOGASTRODUODENOSCOPY (EGD) WITH PROPOFOL
Anesthesia: General

## 2022-03-30 DIAGNOSIS — R7989 Other specified abnormal findings of blood chemistry: Secondary | ICD-10-CM | POA: Diagnosis not present

## 2022-04-03 ENCOUNTER — Ambulatory Visit: Payer: BC Managed Care – PPO | Admitting: Gastroenterology

## 2022-04-07 ENCOUNTER — Encounter: Payer: Self-pay | Admitting: Internal Medicine

## 2022-05-15 ENCOUNTER — Telehealth: Payer: Self-pay

## 2022-05-15 NOTE — Telephone Encounter (Signed)
Patient sent a mychart message to schedule a follow up appointment with Dr. Marius Ditch for abdominal pain. Patient canceled his schedule EGD and Canceled his follow up appointment on 04/03/2022. Called patient back and informed him our next available appointment was 08/15/2022 in [place]. He states that is to far out and he will not schedule that far in advance. Informed him if he calls back it could be May. He declined to make a appointment

## 2022-05-17 ENCOUNTER — Ambulatory Visit: Payer: BC Managed Care – PPO | Admitting: Internal Medicine

## 2022-05-17 ENCOUNTER — Encounter: Payer: Self-pay | Admitting: Internal Medicine

## 2022-05-17 VITALS — BP 142/84 | HR 82 | Temp 97.1°F | Wt 169.0 lb

## 2022-05-17 DIAGNOSIS — K200 Eosinophilic esophagitis: Secondary | ICD-10-CM

## 2022-05-17 DIAGNOSIS — R030 Elevated blood-pressure reading, without diagnosis of hypertension: Secondary | ICD-10-CM | POA: Diagnosis not present

## 2022-05-17 MED ORDER — PREDNISONE 10 MG PO TABS: ORAL_TABLET | ORAL | 0 refills | Status: DC

## 2022-05-17 NOTE — Progress Notes (Signed)
Subjective:    Patient ID: Jack Hicks, male    DOB: Nov 16, 1990, 32 y.o.   MRN: [id number]  HPI  Patient presents to clinic today with complaint of abdominal pain.  He reports this started around August last year.  He describes the pain as crampy.  He eventually saw GI and was diagnosed with eosinophilic esophagitis.  He is taking Omeprazole 2 times daily and Famotidine. He has seen GI, but can not get an appt until April. He has been under some stress lately.  He did wean down on his Omeprazole to 1 tablet a few times a week but has restarted this back along with the Famotidine.  Of note, his BP today is 144/86. He is not taking any antihypertensive medications at this time.   Review of Systems     Past Medical History:  Diagnosis Date   Allergy    Heart murmur    as child   History of chicken pox    Low testosterone    Thyroid disease    borderline Hypothyroidism     Current Outpatient Medications  Medication Sig Dispense Refill   clomiPHENE (CLOMID) 50 MG tablet Take 25 mg by mouth daily.     hydrOXYzine (ATARAX) 10 MG/5ML syrup Take 12.5 mLs (25 mg total) by mouth 3 (three) times daily as needed. 240 mL 0   omeprazole (PRILOSEC) 40 MG capsule Take 1 capsule (40 mg total) by mouth 2 (two) times daily before a meal. 60 capsule 2   No current facility-administered medications for this visit.    No Known Allergies  Family History  Problem Relation Age of Onset   Hyperlipidemia Mother    Hypertension Mother    Hyperlipidemia Father    Hypertension Father    Healthy Sister    Hyperlipidemia Maternal Grandmother    Hypertension Maternal Grandmother    Prostate cancer Maternal Grandfather    Hyperlipidemia Maternal Grandfather    Stroke Maternal Grandfather    Hypertension Maternal Grandfather    Hyperlipidemia Paternal Grandmother    Hypertension Paternal Grandmother    Arthritis Paternal Grandfather    Hyperlipidemia Paternal Grandfather    Hypertension  Paternal Grandfather     Social History   Socioeconomic History   Marital status: Married    Spouse name: Not on file   Number of children: Not on file   Years of education: Not on file   Highest education level: Not on file  Occupational History   Not on file  Tobacco Use   Smoking status: Never   Smokeless tobacco: Never  Vaping Use   Vaping Use: Never used  Substance and Sexual Activity   Alcohol use: Yes    Alcohol/week: 1.0 standard drink of alcohol    Types: 1 Standard drinks or equivalent per week   Drug use: No   Sexual activity: Yes  Other Topics Concern   Not on file  Social History Narrative   Not on file   Social Determinants of Health   Financial Resource Strain: Not on file  Food Insecurity: Not on file  Transportation Needs: Not on file  Physical Activity: Not on file  Stress: Not on file  Social Connections: Not on file  Intimate Partner Violence: Not on file     Constitutional: Denies fever, malaise, fatigue, headache or abrupt weight changes.  Respiratory: Denies difficulty breathing, shortness of breath, cough or sputum production.   Cardiovascular: Denies chest pain, chest tightness, palpitations or swelling in the hands  or feet.  Gastrointestinal: Patient reports abdominal pain.  Denies bloating, constipation, diarrhea or blood in the stool.  GU: Denies urgency, frequency, pain with urination, burning sensation, blood in urine, odor or discharge.  No other specific complaints in a complete review of systems (except as listed in HPI above).  Objective:   Physical Exam  BP (!) 142/84 (BP Location: Left Arm, Patient Position: Sitting, Cuff Size: Normal)   Pulse 82   Temp (!) 97.1 F (36.2 C) (Temporal)   Wt 169 lb (76.7 kg)   SpO2 99%   BMI 26.47 kg/m  There were no vitals taken for this visit. Wt Readings from Last 3 Encounters:  01/06/22 165 lb (74.8 kg)  01/04/22 164 lb 6 oz (74.6 kg)  12/27/21 161 lb (73 kg)    General: Appears  his stated age, overweight, in NAD. Cardiovascular: Normal rate and rhythm. S1,S2 noted.  No murmur, rubs or gallops noted.  Pulmonary/Chest: Normal effort and positive vesicular breath sounds. No respiratory distress. No wheezes, rales or ronchi noted.  Abdomen: Soft and mildly tender in LUQ. Normal bowel sounds. No distention or masses noted. Liver, spleen and kidneys non palpable. Neurological: Alert and oriented.    BMET    Component Value Date/Time   NA 137 12/25/2021 0422   K 3.8 12/25/2021 0422   CL 99 12/25/2021 0422   CO2 28 12/25/2021 0422   GLUCOSE 117 (H) 12/25/2021 0422   BUN 20 12/25/2021 0422   CREATININE 1.03 12/25/2021 0422   CREATININE 1.08 11/15/2021 1114   CALCIUM 9.1 12/25/2021 0422   GFRNONAA >60 12/25/2021 0422   GFRAA >60 04/25/2019 0817    Lipid Panel     Component Value Date/Time   CHOL 178 11/15/2021 1114   TRIG 106 11/15/2021 1114   HDL 52 11/15/2021 1114   CHOLHDL 3.4 11/15/2021 1114   VLDL 19.4 07/03/2019 1204   LDLCALC 106 (H) 11/15/2021 1114    CBC    Component Value Date/Time   WBC 6.1 12/25/2021 0422   RBC 5.50 12/25/2021 0422   HGB 16.5 12/25/2021 0422   HCT 47.9 12/25/2021 0422   PLT 268 12/25/2021 0422   MCV 87.1 12/25/2021 0422   MCH 30.0 12/25/2021 0422   MCHC 34.4 12/25/2021 0422   RDW 12.5 12/25/2021 0422   LYMPHSABS 1.8 12/22/2021 1820   MONOABS 0.6 12/22/2021 1820   EOSABS 0.0 12/22/2021 1820   BASOSABS 0.0 12/22/2021 1820    Hgb A1C Lab Results  Component Value Date   HGBA1C 5.5 09/22/2020           Assessment & Plan:   Elevated Blood Pressure Reading in Office without Diagnosis of HTN:  Likely due to combination of abdominal pain, lack of sleep and birth of new baby Will monitor for now  RTC in 7 months for annual exam Webb Silversmith, NP

## 2022-05-17 NOTE — Assessment & Plan Note (Signed)
Currently having a flare that seems to be improving Rx for Pred taper x 9 days for symptom management Continue omeprazole for famotidine as previously prescribed Follow-up with GI if symptoms persist or worsen

## 2022-05-17 NOTE — Patient Instructions (Signed)
Esophagitis  Esophagitis is inflammation of the esophagus. The esophagus is the tube that carries food from the mouth to the stomach. Esophagitis can cause soreness or pain in the esophagus. This condition can make it difficult and painful to swallow. What are the causes? Most causes of esophagitis are not serious. Common causes of this condition include: Gastroesophageal reflux disease (GERD). This is when stomach contents move back up into the esophagus (reflux). Repeated vomiting. An allergic reaction, especially caused by food allergies (eosinophilic esophagitis). Injury to the esophagus by swallowing large pills with or without water, or swallowing certain types of medicines. Swallowing harmful chemicals, such as household cleaning products. Drinking a lot of alcohol. An infection of the esophagus. This most often occurs in people who have a weakened immune system. Radiation or chemotherapy treatment for cancer. Certain diseases such as sarcoidosis, Crohn's disease, and scleroderma. What are the signs or symptoms? Symptoms of this condition include: Difficult or painful swallowing. Pain with swallowing acidic liquids, such as citrus juices. You may also have pain when you burp. Chest pain and difficulty breathing. Nausea and vomiting. Pain in the abdomen. Weight loss. Ulcers in the mouth and white patches in the mouth (candidiasis). Fever. Coughing up blood or vomiting blood. Stool that is black, tarry, or bright red. How is this diagnosed? This condition may be diagnosed based on your medical history and a physical exam. You may also have other tests, including: A test to examine your esophagus and stomach with a small flexible tube with a camera (endoscopy). A test that measures the acidity level in your esophagus. A test that measures how much pressure is on your esophagus. A barium swallow or modified barium swallow to show the shape, size, and functioning of your  esophagus. Allergy tests. How is this treated? Treatment for this condition depends on the cause of your esophagitis. In some cases, steroids or other medicines may be given to help relieve your symptoms or to treat the underlying cause of your condition. You may have to make some lifestyle changes, such as: Avoiding alcohol. Quitting any products that contain nicotine or tobacco. These products include cigarettes, chewing tobacco, and vaping devices, such as e-cigarettes. If you need help quitting, ask your health care provider. Changing your diet. Exercising. Changing your sleep habits and your sleep environment. Follow these instructions at home: Medicines Take over-the-counter and prescription medicines only as told by your health care provider. Do not take aspirin, ibuprofen, or other NSAIDs unless your health care provider told you to do so. If you have trouble taking pills: Use a pill splitter to decrease the size of the pill. This will decrease the chance of the pill getting stuck or injuring your esophagus. Drink water after you take a pill. Eating and drinking  Avoid foods and drinks that seem to make your symptoms worse. Follow a diet as recommended by your health care provider. This may involve avoiding foods and drinks such as: Coffee and tea, with or without caffeine. Drinks that contain alcohol. Energy drinks and sports drinks. Carbonated drinks or sodas. Chocolate and cocoa. Peppermint and mint flavorings. Garlic and onions. Horseradish. Spicy and acidic foods, including peppers, chili powder, curry powder, vinegar, hot sauces, and barbecue sauce. Citrus fruit juices and citrus fruits, such as oranges, lemons, and limes. Tomato-based foods, such as red sauce, chili, salsa, and pizza with red sauce. Fried and fatty foods, such as donuts, french fries, potato chips, and high-fat dressings. High-fat meats, such as hot dogs and fatty   cuts of red and white meats, such as  rib eye steak, sausage, ham, and bacon. High-fat dairy items, such as whole milk, butter, and cream cheese. Lifestyle Eat small, frequent meals instead of large meals. Avoid drinking large amounts of liquid with your meals. Avoid eating meals during the 2-3 hours before bedtime. Avoid lying down right after you eat. Do not exercise right after you eat. Do not use any products that contain nicotine or tobacco. These products include cigarettes, chewing tobacco, and vaping devices, such as e-cigarettes. If you need help quitting, ask your health care provider. General instructions  Pay attention to any changes in your symptoms. Let your health care provider know about them. Wear loose-fitting clothing. Do not wear anything tight around your waist that causes pressure on your abdomen. Raise (elevate) the head of your bed about 6 inches (15 cm). You may need to use a wedge to do this. Try relaxation strategies such as yoga, deep breathing, or meditation to manage stress. If you need help reducing stress, ask your health care provider. If you are overweight, reduce your weight to an amount that is healthy for you. Ask your health care provider for guidance about a safe weight loss goal. Keep all follow-up visits. This is important. Contact a health care provider if: You have new symptoms. You have unexplained weight loss. You have difficulty swallowing, or it hurts to swallow. You have wheezing or a cough that does not go away. Your symptoms do not improve with treatment. You have frequent heartburn for more than two weeks. Get help right away if: You have sudden severe pain in your arms, neck, jaw, teeth, or back. You suddenly feel sweaty, dizzy, or light-headed. You have chest pain or shortness of breath. You vomit and the vomit is green, yellow, or black, or it looks like blood or coffee grounds. Your stool is red, bloody, or black. You have a fever. You cannot swallow, drink, or  eat. These symptoms may represent a serious problem that is an emergency. Do not wait to see if the symptoms will go away. Get medical help right away. Call your local emergency services (911 in the [place] Do not drive yourself to the hospital. Summary Esophagitis is inflammation of the esophagus. Most causes of esophagitis are not serious. Follow your health care provider's instructions about eating and drinking. Contact a health care provider if you have new symptoms, have weight loss, or coughing that does not stop. Get help right away if you have severe pain in the arms, neck, jaw, teeth, or back, or if you have chest pain, shortness of breath, or fever. This information is not intended to replace advice given to you by your health care provider. Make sure you discuss any questions you have with your health care provider. Document Revised: 10/13/2019 Document Reviewed: 10/13/2019 Elsevier Patient Education  2023 Elsevier Inc.  

## 2022-06-08 ENCOUNTER — Ambulatory Visit: Payer: BC Managed Care – PPO | Admitting: Urology

## 2022-06-08 ENCOUNTER — Encounter: Payer: Self-pay | Admitting: Urology

## 2022-06-08 VITALS — BP 142/92 | HR 85 | Ht 67.0 in | Wt 163.0 lb

## 2022-06-08 DIAGNOSIS — Z3009 Encounter for other general counseling and advice on contraception: Secondary | ICD-10-CM | POA: Diagnosis not present

## 2022-06-08 NOTE — Progress Notes (Signed)
06/08/2022 9:25 AM   [name] 04-Feb-1991 [id number]  Referring provider: Jearld Fenton, NP [place],  [place] [id number]  Chief Complaint  Patient presents with   VAS    HPI: Jack Hicks is a 32 y.o. male who presents for vasectomy counseling.  Married with 3 children Denies prior history urologic problems including chronic scrotal pain, epididymitis or orchitis No previous history inguinal hernia or pelvic surgery No history of bleeding or clotting disorders Followed by endocrinology for hypogonadism on Clomid   PMH: Past Medical History:  Diagnosis Date   Allergy    Heart murmur    as child   History of chicken pox    Low testosterone    Thyroid disease    borderline Hypothyroidism     Surgical History: Past Surgical History:  Procedure Laterality Date   ESOPHAGOGASTRODUODENOSCOPY (EGD) WITH PROPOFOL N/A 12/27/2021   Procedure: ESOPHAGOGASTRODUODENOSCOPY (EGD) WITH PROPOFOL;  Surgeon: Lin Landsman, MD;  Location: [facility];  Service: Endoscopy;  Laterality: N/A;   TISSUE GRAFT Left 2017    Home Medications:  Allergies as of 06/08/2022   No Known Allergies      Medication List        Accurate as of June 08, 2022  9:25 AM. If you have any questions, ask your nurse or doctor.          clomiPHENE 50 MG tablet Commonly known as: CLOMID Take 25 mg by mouth daily.   hydrOXYzine 10 MG/5ML syrup Commonly known as: ATARAX Take 12.5 mLs (25 mg total) by mouth 3 (three) times daily as needed.   omeprazole 40 MG capsule Commonly known as: PRILOSEC Take 1 capsule (40 mg total) by mouth 2 (two) times daily before a meal.   predniSONE 10 MG tablet Commonly known as: DELTASONE Take 3 tabs on days 1-3, 2 tabs on days 4-6, 1 tab on days 7-9        Allergies: No Known Allergies  Family History: Family History  Problem Relation Age of Onset   Hyperlipidemia Mother    Hypertension Mother    Hyperlipidemia  Father    Hypertension Father    Healthy Sister    Hyperlipidemia Maternal Grandmother    Hypertension Maternal Grandmother    Prostate cancer Maternal Grandfather    Hyperlipidemia Maternal Grandfather    Stroke Maternal Grandfather    Hypertension Maternal Grandfather    Hyperlipidemia Paternal Grandmother    Hypertension Paternal Grandmother    Arthritis Paternal Grandfather    Hyperlipidemia Paternal Grandfather    Hypertension Paternal Grandfather     Social History:  reports that he has never smoked. He has never used smokeless tobacco. He reports current alcohol use of about 1.0 standard drink of alcohol per week. He reports that he does not use drugs.   Physical Exam: BP (!) 142/92   Pulse 85   Ht 5' 7"$  (1.702 m)   Wt 163 lb (73.9 kg)   BMI 25.53 kg/m   Constitutional:  Alert and oriented, No acute distress. HEENT: [place] AT, moist mucus membranes.  Trachea midline, no masses. Cardiovascular: No clubbing, cyanosis, or edema. Respiratory: Normal respiratory effort, no increased work of breathing. GI: Abdomen is soft, nontender, nondistended, no abdominal masses GU: Phallus without lesions, testes descended bilaterally without masses or tenderness, spermatic cord/epididymis palpably normal bilaterally.  Vasa easily palpable bilaterally Skin: No rashes, bruises or suspicious lesions. Neurologic: Grossly intact, no focal deficits, moving all 4 extremities.  Psychiatric: Normal mood and affect.   Assessment & Plan:    1.  Undesired fertility Desires to schedule vasectomy We had a long discussion about vasectomy. We specifically discussed the procedure, recovery and the risks, benefits and alternatives of vasectomy. I explained that the procedure entails removal of a segment of each vas deferens, each of which conducts sperm, and that the purpose of this procedure is to cause sterility (inability to produce children or cause pregnancy). Vasectomy is intended to be permanent and  irreversible form of contraception. Options for fertility after vasectomy include vasectomy reversal, or sperm retrieval with in vitro fertilization. These options are not always successful, and they may be expensive. We discussed reversible forms of birth control such as condoms, IUD or diaphragms, as well as the option of freezing sperm in a sperm bank prior to the vasectomy procedure. We discussed the importance of avoiding strenuous exercise for four days after vasectomy, and the importance of refraining from any form of ejaculation for seven days after vasectomy. I explained that vasectomy does not produce immediate sterility so another form of contraceptive must be used until sterility is assured by having semen checked for sperm. Thus, a post vasectomy semen analysis is necessary to confirm sterility. Rarely, vasectomy must be repeated. We discussed the approximately 1 in 2,000 risk of pregnancy after vasectomy for men who have post-vasectomy semen analysis showing absent sperm or rare non-motile sperm. Typical side effects include a small amount of oozing blood, some discomfort and mild swelling in the area of incision.  Vasectomy does not affect sexual performance, function, please, sensation, interest, desire, satisfaction, penile erection, volume of semen or ejaculation. Other rare risks include allergy or adverse reaction to an anesthetic, testicular atrophy, hematoma, infection/abscess, prolonged tenderness of the vas deferens, pain, swelling, painful nodule or scar (called sperm granuloma) or epididymtis. We discussed chronic testicular pain syndrome. This has been reported to occur in as many as 1-2% of men and may be permanent. This can be treated with medication, small procedures or (rarely) surgery. He indicated he would call back if he desires a preprocedure anxiolytic and would need a driver if utilizing   Abbie Sons, [place] [address], [place], [place] [address] [phone]

## 2022-06-08 NOTE — Patient Instructions (Signed)

## 2022-06-12 ENCOUNTER — Ambulatory Visit: Payer: BC Managed Care – PPO | Admitting: Internal Medicine

## 2022-06-12 NOTE — Progress Notes (Deleted)
Subjective:    Patient ID: Jack Hicks, male    DOB: [birth year], 32 y.o.   MRN: [id number]  HPI  Patient presents to clinic today with complaint of sore throat.  This started.  He has had exposure to strep throat by his son.  Review of Systems     Past Medical History:  Diagnosis Date   Allergy    Heart murmur    as child   History of chicken pox    Low testosterone    Thyroid disease    borderline Hypothyroidism     Current Outpatient Medications  Medication Sig Dispense Refill   clomiPHENE (CLOMID) 50 MG tablet Take 25 mg by mouth daily.     hydrOXYzine (ATARAX) 10 MG/5ML syrup Take 12.5 mLs (25 mg total) by mouth 3 (three) times daily as needed. 240 mL 0   omeprazole (PRILOSEC) 40 MG capsule Take 1 capsule (40 mg total) by mouth 2 (two) times daily before a meal. 60 capsule 2   predniSONE (DELTASONE) 10 MG tablet Take 3 tabs on days 1-3, 2 tabs on days 4-6, 1 tab on days 7-9 18 tablet 0   No current facility-administered medications for this visit.    No Known Allergies  Family History  Problem Relation Age of Onset   Hyperlipidemia Mother    Hypertension Mother    Hyperlipidemia Father    Hypertension Father    Healthy Sister    Hyperlipidemia Maternal Grandmother    Hypertension Maternal Grandmother    Prostate cancer Maternal Grandfather    Hyperlipidemia Maternal Grandfather    Stroke Maternal Grandfather    Hypertension Maternal Grandfather    Hyperlipidemia Paternal Grandmother    Hypertension Paternal Grandmother    Arthritis Paternal Grandfather    Hyperlipidemia Paternal Grandfather    Hypertension Paternal Grandfather     Social History   Socioeconomic History   Marital status: Married    Spouse name: Not on file   Number of children: Not on file   Years of education: Not on file   Highest education level: Not on file  Occupational History   Not on file  Tobacco Use   Smoking status: Never   Smokeless tobacco: Never  Vaping Use    Vaping Use: Never used  Substance and Sexual Activity   Alcohol use: Yes    Alcohol/week: 1.0 standard drink of alcohol    Types: 1 Standard drinks or equivalent per week   Drug use: No   Sexual activity: Yes  Other Topics Concern   Not on file  Social History Narrative   Not on file   Social Determinants of Health   Financial Resource Strain: Not on file  Food Insecurity: Not on file  Transportation Needs: Not on file  Physical Activity: Not on file  Stress: Not on file  Social Connections: Not on file  Intimate Partner Violence: Not on file     Constitutional: Denies fever, malaise, fatigue, headache or abrupt weight changes.  HEENT: Pt report sore throat. Denies eye pain, eye redness, ear pain, ringing in the ears, wax buildup, runny nose, nasal congestion, bloody nose. Respiratory: Denies difficulty breathing, shortness of breath, cough or sputum production.   Cardiovascular: Denies chest pain, chest tightness, palpitations or swelling in the hands or feet.  Gastrointestinal: Denies abdominal pain, bloating, constipation, diarrhea or blood in the stool.  GU: Denies urgency, frequency, pain with urination, burning sensation, blood in urine, odor or discharge. Musculoskeletal: Denies decrease in range  of motion, difficulty with gait, muscle pain or joint pain and swelling.  Skin: Denies redness, rashes, lesions or ulcercations.  Neurological: Denies dizziness, difficulty with memory, difficulty with speech or problems with balance and coordination.  Psych: Denies anxiety, depression, SI/HI.  No other specific complaints in a complete review of systems (except as listed in HPI above).  Objective:   Physical Exam  There were no vitals taken for this visit. Wt Readings from Last 3 Encounters:  06/08/22 163 lb (73.9 kg)  05/17/22 169 lb (76.7 kg)  01/06/22 165 lb (74.8 kg)    General: Appears their stated age, well developed, well nourished in NAD. Skin: Warm, dry and  intact. No rashes, lesions or ulcerations noted. HEENT: Head: normal shape and size; Eyes: sclera white, no icterus, conjunctiva pink, PERRLA and EOMs intact; Ears: Tm's gray and intact, normal light reflex; Nose: mucosa pink and moist, septum midline; Throat/Mouth: Teeth present, mucosa pink and moist, no exudate, lesions or ulcerations noted.  Neck:  Neck supple, trachea midline. No masses, lumps or thyromegaly present.  Cardiovascular: Normal rate and rhythm. S1,S2 noted.  No murmur, rubs or gallops noted. No JVD or BLE edema. No carotid bruits noted. Pulmonary/Chest: Normal effort and positive vesicular breath sounds. No respiratory distress. No wheezes, rales or ronchi noted.  Abdomen: Soft and nontender. Normal bowel sounds. No distention or masses noted. Liver, spleen and kidneys non palpable. Musculoskeletal: Normal range of motion. No signs of joint swelling. No difficulty with gait.  Neurological: Alert and oriented. Cranial nerves II-XII grossly intact. Coordination normal.  Psychiatric: Mood and affect normal. Behavior is normal. Judgment and thought content normal.   BMET    Component Value Date/Time   NA 137 12/25/2021 0422   K 3.8 12/25/2021 0422   CL 99 12/25/2021 0422   CO2 28 12/25/2021 0422   GLUCOSE 117 (H) 12/25/2021 0422   BUN 20 12/25/2021 0422   CREATININE 1.03 12/25/2021 0422   CREATININE 1.08 11/15/2021 1114   CALCIUM 9.1 12/25/2021 0422   GFRNONAA >60 12/25/2021 0422   GFRAA >60 04/25/2019 0817    Lipid Panel     Component Value Date/Time   CHOL 178 11/15/2021 1114   TRIG 106 11/15/2021 1114   HDL 52 11/15/2021 1114   CHOLHDL 3.4 11/15/2021 1114   VLDL 19.4 07/03/2019 1204   LDLCALC 106 (H) 11/15/2021 1114    CBC    Component Value Date/Time   WBC 6.1 12/25/2021 0422   RBC 5.50 12/25/2021 0422   HGB 16.5 12/25/2021 0422   HCT 47.9 12/25/2021 0422   PLT 268 12/25/2021 0422   MCV 87.1 12/25/2021 0422   MCH 30.0 12/25/2021 0422   MCHC 34.4  12/25/2021 0422   RDW 12.5 12/25/2021 0422   LYMPHSABS 1.8 12/22/2021 1820   MONOABS 0.6 12/22/2021 1820   EOSABS 0.0 12/22/2021 1820   BASOSABS 0.0 12/22/2021 1820    Hgb A1C Lab Results  Component Value Date   HGBA1C 5.5 09/22/2020            Assessment & Plan:      RTC in 6 months for your annual exam Webb Silversmith, NP

## 2022-06-14 ENCOUNTER — Encounter: Payer: Self-pay | Admitting: Internal Medicine

## 2022-06-20 MED ORDER — ESCITALOPRAM OXALATE 10 MG PO TABS: 10.0000 mg | ORAL_TABLET | Freq: Every day | ORAL | 1 refills | Status: DC

## 2022-06-20 NOTE — Addendum Note (Signed)
Addended by: Jearld Fenton on: 06/20/2022 10:05 AM   Modules accepted: Orders

## 2022-06-25 ENCOUNTER — Other Ambulatory Visit: Payer: Self-pay | Admitting: Internal Medicine

## 2022-06-26 NOTE — Telephone Encounter (Signed)
Requested Prescriptions  Pending Prescriptions Disp Refills   hydrOXYzine (ATARAX) 10 MG/5ML syrup [Pharmacy Med Name: HYDROXYZINE 10 MG/5 ML SYRUP] 240 mL 0    Sig: TAKE 12.5 MLS (25 MG TOTAL) BY MOUTH 3 (THREE) TIMES DAILY AS NEEDED.     Ear, Nose, and Throat:  Antihistamines 2 Passed - 06/25/2022  9:48 PM      Passed - Cr in normal range and within 360 days    Creat  Date Value Ref Range Status  11/15/2021 1.08 0.60 - 1.26 mg/dL Final   Creatinine, Ser  Date Value Ref Range Status  12/25/2021 1.03 0.61 - 1.24 mg/dL Final         Passed - Valid encounter within last 12 months    Recent Outpatient Visits           1 month ago Eosinophilic esophagitis   [facility] [place], Coralie Keens, NP   5 months ago [facility] [place], Coralie Keens, NP   7 months ago Encounter for general adult medical examination with abnormal findings   [facility] [place], Coralie Keens, NP   1 year ago Acute nasopharyngitis   [facility] [place], Coralie Keens, NP   1 year ago Pure hypercholesterolemia   [facility] [place], Coralie Keens, [place]

## 2022-07-18 ENCOUNTER — Other Ambulatory Visit: Payer: Self-pay

## 2022-07-18 MED ORDER — DIAZEPAM 10 MG PO TABS: ORAL_TABLET | ORAL | 0 refills | Status: DC

## 2022-07-18 NOTE — Telephone Encounter (Signed)
Patient called in today, he is scheduled for his VAS in office on Thursday. He will now have a driver to bring him to appointment and would like if you could send him in a Valium prior to procedure to [facility] on [address].

## 2022-07-20 ENCOUNTER — Encounter: Payer: Self-pay | Admitting: Urology

## 2022-07-20 ENCOUNTER — Ambulatory Visit: Payer: BC Managed Care – PPO | Admitting: Urology

## 2022-07-20 VITALS — BP 132/74 | HR 102 | Ht 68.0 in | Wt 165.0 lb

## 2022-07-20 DIAGNOSIS — Z3009 Encounter for other general counseling and advice on contraception: Secondary | ICD-10-CM

## 2022-07-20 DIAGNOSIS — Z302 Encounter for sterilization: Secondary | ICD-10-CM

## 2022-07-20 MED ORDER — HYDROCODONE-ACETAMINOPHEN 5-325 MG PO TABS: 1.0000 | ORAL_TABLET | Freq: Four times a day (QID) | ORAL | 0 refills | Status: DC | PRN

## 2022-07-20 NOTE — Patient Instructions (Signed)

## 2022-07-20 NOTE — Progress Notes (Signed)
   Vasectomy Procedure Note  Indications: Jack Hicks is a 32 y.o. male who presents today for elective sterilization.  He has been consented for the procedure.  He is aware of the risks and benefits.  He had no additional questions.  He agrees to proceed.  He denies any other significant change since his last visit.  Pre-operative Diagnosis: Elective sterilization  Post-operative Diagnosis: Elective sterilization  Premedication: N/A  Surgeon: Lorin Picket C. Bruna Dills, M.D  Description: The patient was prepped and draped in the standard fashion.  The right vas deferens was identified and brought superiorly to the anterior scrotal skin.  The skin and vas were then anesthetized utilizing 5 ml 1% lidocaine.  A small stab incision was made and spread with the vas dissector.  The vas was grasped utilizing the vas clamp and elevated out of the incision.  The vas was dissected free from surrounding tissue and vessels and an ~1 cm segment was excised.  The vas lumens were cauterized utilizing electrocautery.  The distal segment was buried in the surrounding sheath with a 3-0 chromic suture.  No significant bleeding was observed.  The vas ends were then dropped back into the hemiscrotum.  The skin was closed with hemostatic pressure.  An identical procedure was performed on the contralateral side.  Clean dry gauze was applied to the incision sites.  The patient tolerated the procedure well.  Complications:None  Recommendations: 1.  No lifting greater than 10 pounds or strenuous activity for 1 week. 2.  Scrotal support for 1-2 weeks. 3.  May shower in 24 hours; no bath, hot tub for 1 week 4.  No intercourse for at least 7 days and resume based on level of discomfort  5.  Continue alternate contraception for 12 weeks.  6.  Call for significant pain, swelling, redness, drainage or fever greater than 100.5. 7.  Rx hydrocodone/APAP 5/325 1-2 every 6 hours prn pain. 8.  Follow-up semen analysis in 12  weeks.   [name], MD

## 2022-07-21 ENCOUNTER — Encounter: Payer: Self-pay | Admitting: Urology

## 2022-07-26 DIAGNOSIS — R7989 Other specified abnormal findings of blood chemistry: Secondary | ICD-10-CM | POA: Diagnosis not present

## 2022-08-02 DIAGNOSIS — R7989 Other specified abnormal findings of blood chemistry: Secondary | ICD-10-CM | POA: Diagnosis not present

## 2022-08-22 ENCOUNTER — Other Ambulatory Visit: Payer: Self-pay | Admitting: Internal Medicine

## 2022-08-23 NOTE — Telephone Encounter (Signed)
  Requested Prescriptions  Pending Prescriptions Disp Refills   hydrOXYzine (ATARAX) 10 MG/5ML syrup [Pharmacy Med Name: HYDROXYZINE 10 MG/5 ML SYRUP] 240 mL 0    Sig: TAKE 12.5 MLS (25 MG TOTAL) BY MOUTH 3 (THREE) TIMES DAILY AS NEEDED.     Ear, Nose, and Throat:  Antihistamines 2 Passed - 08/22/2022 10:48 PM      Passed - Cr in normal range and within 360 days    Creat  Date Value Ref Range Status  11/15/2021 1.08 0.60 - 1.26 mg/dL Final   Creatinine, Ser  Date Value Ref Range Status  12/25/2021 1.03 0.61 - 1.24 mg/dL Final         Passed - Valid encounter within last 12 months    Recent Outpatient Visits           3 months ago Eosinophilic esophagitis   [facility] [place], Salvadore Oxford, NP   7 months ago [employer]   [place] [facility] [place], Salvadore Oxford, NP   9 months ago Encounter for general adult medical examination with abnormal findings   [place] [facility] [place], Salvadore Oxford, NP   1 year ago Acute nasopharyngitis   [place] [facility] [place], Salvadore Oxford, NP   1 year ago Pure hypercholesterolemia   [place] [facility] [place], Salvadore Oxford, [place]

## 2022-09-25 ENCOUNTER — Encounter: Payer: Self-pay | Admitting: Internal Medicine

## 2022-09-25 ENCOUNTER — Ambulatory Visit: Payer: BC Managed Care – PPO | Admitting: Internal Medicine

## 2022-09-25 VITALS — BP 124/72 | HR 97 | Temp 96.9°F | Wt 174.0 lb

## 2022-09-25 DIAGNOSIS — L659 Nonscarring hair loss, unspecified: Secondary | ICD-10-CM

## 2022-09-25 DIAGNOSIS — E349 Endocrine disorder, unspecified: Secondary | ICD-10-CM

## 2022-09-25 NOTE — Patient Instructions (Signed)
Alopecia Areata, Adult  Alopecia areata is a condition that causes hair loss. A person with this condition may lose hair on the scalp in patches. In some cases, a person may lose all the hair on the scalp or all the hair from the face and body. Having this condition can be emotionally difficult, but it is not dangerous. Alopecia areata is an autoimmune disease. This means that your body's defense system (immune system) mistakes normal parts of the body for germs or other things that can make you sick. When you have alopecia areata, the immune system attacks the hair follicles. What are the causes? The cause of this condition is not known. What increases the risk? You are more likely to develop this condition if you have: A family history of alopecia. A family history of another autoimmune disease, including type 1 diabetes and thyroid autoimmune disease. Eczema, asthma, and allergies. Down syndrome. What are the signs or symptoms? The main symptom of this condition is round spots of patchy hair loss on the scalp. The spots may be mildly itchy. Other symptoms include: Short dark hairs in the bald patches that are wider at the top (exclamation point hairs). Dents, white spots, or lines in the fingernails or toenails. Balding and body hair loss. This is rare. Alopecia areata usually develops in childhood, but it can develop at any age. For some people, their hair grows back on its own and hair loss does not happen again. For others, their hair may fall out and grow back in cycles. The hair loss may last many years. How is this diagnosed? This condition is diagnosed based on your symptoms and family history. Your health care provider will also check your scalp skin, teeth, and nails. Your health care provider may refer you to a specialist in hair and skin disorders (dermatologist). You may also have tests, including: A hair pull test. Blood tests or other screening tests to check for autoimmune  diseases, such as thyroid disease or diabetes. Skin biopsy to confirm the diagnosis. A procedure to examine the skin with a lighted magnifying instrument (dermoscopy). How is this treated? There is no cure for alopecia areata. The goals of treatment are to promote the regrowth of hair and prevent the immune system from overreacting. No single treatment is right for all people with alopecia areata. It depends on the type of hair loss you have and how severe it is. Work with your health care provider to find the best treatment for you. Treatment may include: Regular checkups to make sure the condition is not getting worse . This is called watchful waiting. Using steroid creams or pills for 6-8 weeks to stop the immune reaction and help hair to regrow more quickly. Using other medicines on your skin (topical medicines) to change the immune system response and support the hair growth cycle. Steroid injections. Therapy and counseling with a support group or therapist if you are having trouble coping with hair loss. Follow these instructions at home: Medicines Apply topical creams only as told by your health care provider. Take over-the-counter and prescription medicines only as told by your health care provider. General instructions Learn as much as you can about your condition. Consider getting a wig or products to make hair look fuller or to cover bald spots, if you feel uncomfortable with your appearance. Get therapy or counseling if you are having a hard time coping with hair loss. Ask your health care provider to recommend a counselor or support group. Keep   all follow-up visits as told by your health care provider. This is important. Where to find more information National Alopecia Areata Foundation: naaf.org Contact a health care provider if: Your hair loss gets worse, even with treatment. You have new symptoms. You are struggling emotionally. Get help right away if: You have a sudden  worsening of the hair loss. Summary Alopecia areata is an autoimmune condition that makes your body's defense system (immune system) attack the hair follicles. This causes you to lose hair. Having this condition can be emotionally difficult, but it is not dangerous. Treatments may include regular checkups to make sure that the condition is not getting worse, medicines, and steroid injections. This information is not intended to replace advice given to you by your health care provider. Make sure you discuss any questions you have with your health care provider. Document Revised: 06/17/2019 Document Reviewed: 06/17/2019 Elsevier Patient Education  2024 [employer].

## 2022-09-25 NOTE — Progress Notes (Signed)
Subjective:    Patient ID: Jack Hicks, male    DOB: 1990/06/01, 32 y.o.   MRN: [id number]  HPI  Patient presents to clinic today with complaint of thinning hair.  He noticed this months ago but does feel like this is worsening.  He has not noticed any bald spots. He is taking Clomid for low testosterone and he thinks this may causing the hair thinning.  Review of Systems     Past Medical History:  Diagnosis Date   Allergy    Heart murmur    as child   History of chicken pox    Low testosterone    Thyroid disease    borderline Hypothyroidism     Current Outpatient Medications  Medication Sig Dispense Refill   escitalopram (LEXAPRO) 10 MG tablet Take 1 tablet (10 mg total) by mouth daily. 90 tablet 1   clomiPHENE (CLOMID) 50 MG tablet Take 25 mg by mouth daily.     diazepam (VALIUM) 10 MG tablet 1 tab by mouth 30-60 minutes prior to procedure 1 tablet 0   HYDROcodone-acetaminophen (NORCO/VICODIN) 5-325 MG tablet Take 1 tablet by mouth every 6 (six) hours as needed for moderate pain. 8 tablet 0   hydrOXYzine (ATARAX) 10 MG/5ML syrup TAKE 12.5 MLS (25 MG TOTAL) BY MOUTH 3 (THREE) TIMES DAILY AS NEEDED. 240 mL 0   omeprazole (PRILOSEC) 40 MG capsule Take 1 capsule (40 mg total) by mouth 2 (two) times daily before a meal. 60 capsule 2   No current facility-administered medications for this visit.    No Known Allergies  Family History  Problem Relation Age of Onset   Hyperlipidemia Mother    Hypertension Mother    Hyperlipidemia Father    Hypertension Father    Healthy Sister    Hyperlipidemia Maternal Grandmother    Hypertension Maternal Grandmother    Prostate cancer Maternal Grandfather    Hyperlipidemia Maternal Grandfather    Stroke Maternal Grandfather    Hypertension Maternal Grandfather    Hyperlipidemia Paternal Grandmother    Hypertension Paternal Grandmother    Arthritis Paternal Grandfather    Hyperlipidemia Paternal Grandfather    Hypertension  Paternal Grandfather     Social History   Socioeconomic History   Marital status: Married    Spouse name: Not on file   Number of children: Not on file   Years of education: Not on file   Highest education level: Not on file  Occupational History   Not on file  Tobacco Use   Smoking status: Never   Smokeless tobacco: Never  Vaping Use   Vaping Use: Never used  Substance and Sexual Activity   Alcohol use: Yes    Alcohol/week: 1.0 standard drink of alcohol    Types: 1 Standard drinks or equivalent per week   Drug use: No   Sexual activity: Yes  Other Topics Concern   Not on file  Social History Narrative   Not on file   Social Determinants of Health   Financial Resource Strain: Not on file  Food Insecurity: Not on file  Transportation Needs: Not on file  Physical Activity: Not on file  Stress: Not on file  Social Connections: Not on file  Intimate Partner Violence: Not on file     Constitutional: Denies fever, malaise, fatigue, headache or abrupt weight changes.  Respiratory: Denies difficulty breathing, shortness of breath, cough or sputum production.   Cardiovascular: Denies chest pain, chest tightness, palpitations or swelling in the hands or  feet.  Musculoskeletal: Denies decrease in range of motion, difficulty with gait, muscle pain or joint pain and swelling.  Skin: Patient reports thinning hair.  Denies redness, rashes, lesions or ulcercations.  Neurological: Denies dizziness, difficulty with memory, difficulty with speech or problems with balance and coordination.  Psych: Patient has a history of anxiety.  Denies depression, SI/HI.  No other specific complaints in a complete review of systems (except as listed in HPI above).  Objective:   Physical Exam   BP 124/72 (BP Location: Left Arm, Patient Position: Sitting, Cuff Size: Normal)   Pulse 97   Temp (!) 96.9 F (36.1 C) (Temporal)   Wt 174 lb (78.9 kg)   SpO2 97%   BMI 26.46 kg/m   Wt Readings  from Last 3 Encounters:  07/20/22 165 lb (74.8 kg)  06/08/22 163 lb (73.9 kg)  05/17/22 169 lb (76.7 kg)    General: Appears his stated age, overweight, in NAD. Skin: Warm, dry and intact. No rashes or bald spots noted of scalp. Cardiovascular: Normal rate. Pulmonary/Chest: Normal effort. Neurological: Alert and oriented. Coordination normal.  Psychiatric: Mood and affect normal. Behavior is normal. Judgment and thought content normal.     BMET    Component Value Date/Time   NA 137 12/25/2021 0422   K 3.8 12/25/2021 0422   CL 99 12/25/2021 0422   CO2 28 12/25/2021 0422   GLUCOSE 117 (H) 12/25/2021 0422   BUN 20 12/25/2021 0422   CREATININE 1.03 12/25/2021 0422   CREATININE 1.08 11/15/2021 1114   CALCIUM 9.1 12/25/2021 0422   GFRNONAA >60 12/25/2021 0422   GFRAA >60 04/25/2019 0817    Lipid Panel     Component Value Date/Time   CHOL 178 11/15/2021 1114   TRIG 106 11/15/2021 1114   HDL 52 11/15/2021 1114   CHOLHDL 3.4 11/15/2021 1114   VLDL 19.4 07/03/2019 1204   LDLCALC 106 (H) 11/15/2021 1114    CBC    Component Value Date/Time   WBC 6.1 12/25/2021 0422   RBC 5.50 12/25/2021 0422   HGB 16.5 12/25/2021 0422   HCT 47.9 12/25/2021 0422   PLT 268 12/25/2021 0422   MCV 87.1 12/25/2021 0422   MCH 30.0 12/25/2021 0422   MCHC 34.4 12/25/2021 0422   RDW 12.5 12/25/2021 0422   LYMPHSABS 1.8 12/22/2021 1820   MONOABS 0.6 12/22/2021 1820   EOSABS 0.0 12/22/2021 1820   BASOSABS 0.0 12/22/2021 1820    Hgb A1C Lab Results  Component Value Date   HGBA1C 5.5 09/22/2020           Assessment & Plan:   Thinning Hair:  No signs of alopecia Discussed that this could be a side effect of his Clomid which she has already cut back on Will check TSH, vitamin D, vitamin B12 and biotin per his request Consider Propecia 1 mg daily as a treatment option He does not want to try Minoxidil  RTC in 2 months for annual exam Nicki Reaper, NP

## 2022-09-27 ENCOUNTER — Other Ambulatory Visit: Payer: BC Managed Care – PPO

## 2022-09-27 DIAGNOSIS — L659 Nonscarring hair loss, unspecified: Secondary | ICD-10-CM | POA: Diagnosis not present

## 2022-09-27 DIAGNOSIS — E349 Endocrine disorder, unspecified: Secondary | ICD-10-CM

## 2022-09-27 DIAGNOSIS — D519 Vitamin B12 deficiency anemia, unspecified: Secondary | ICD-10-CM | POA: Diagnosis not present

## 2022-09-29 ENCOUNTER — Encounter: Payer: Self-pay | Admitting: Urology

## 2022-10-02 ENCOUNTER — Encounter: Payer: Self-pay | Admitting: Internal Medicine

## 2022-10-17 LAB — TESTOSTERONE TOTAL,FREE,BIO, MALES
Albumin: 4.5 g/dL (ref 3.6–5.1)
Sex Hormone Binding: 23 nmol/L (ref 10–50)
Testosterone, Bioavailable: 153.8 ng/dL (ref 110.0–575.0)
Testosterone, Free: 74.8 pg/mL (ref 46.0–224.0)
Testosterone: 427 ng/dL (ref 250–827)

## 2022-10-17 LAB — VITAMIN B12: Vitamin B-12: 588 pg/mL (ref 200–1100)

## 2022-10-17 LAB — BIOTIN (VITAMIN B7)

## 2022-10-17 LAB — VITAMIN D 25 HYDROXY (VIT D DEFICIENCY, FRACTURES): Vit D, 25-Hydroxy: 30 ng/mL (ref 30–100)

## 2022-10-17 LAB — TSH: TSH: 1.06 mIU/L (ref 0.40–4.50)

## 2022-10-19 ENCOUNTER — Other Ambulatory Visit: Payer: Self-pay

## 2022-10-19 ENCOUNTER — Emergency Department: Payer: BC Managed Care – PPO

## 2022-10-19 ENCOUNTER — Emergency Department
Admission: EM | Admit: 2022-10-19 | Discharge: 2022-10-19 | Disposition: A | Discharge status: Home / Self Care | Payer: BC Managed Care – PPO | Attending: Emergency Medicine | Admitting: Emergency Medicine

## 2022-10-19 DIAGNOSIS — R109 Unspecified abdominal pain: Secondary | ICD-10-CM | POA: Diagnosis not present

## 2022-10-19 DIAGNOSIS — N201 Calculus of ureter: Secondary | ICD-10-CM

## 2022-10-19 DIAGNOSIS — N132 Hydronephrosis with renal and ureteral calculous obstruction: Secondary | ICD-10-CM | POA: Diagnosis not present

## 2022-10-19 DIAGNOSIS — N200 Calculus of kidney: Secondary | ICD-10-CM | POA: Diagnosis not present

## 2022-10-19 DIAGNOSIS — N202 Calculus of kidney with calculus of ureter: Secondary | ICD-10-CM | POA: Diagnosis not present

## 2022-10-19 LAB — COMPREHENSIVE METABOLIC PANEL
ALT: 24 U/L (ref 0–44)
AST: 23 U/L (ref 15–41)
Albumin: 4.5 g/dL (ref 3.5–5.0)
Alkaline Phosphatase: 86 U/L (ref 38–126)
Anion gap: 11 (ref 5–15)
BUN: 18 mg/dL (ref 6–20)
CO2: 23 mmol/L (ref 22–32)
Calcium: 9 mg/dL (ref 8.9–10.3)
Chloride: 102 mmol/L (ref 98–111)
Creatinine, Ser: 0.83 mg/dL (ref 0.61–1.24)
GFR, Estimated: >60 mL/min (ref >60)
Glucose, Bld: 111 mg/dL — ABNORMAL HIGH (ref 70–99)
Potassium: 3.8 mmol/L (ref 3.5–5.1)
Sodium: 136 mmol/L (ref 135–145)
Total Bilirubin: 0.6 mg/dL (ref 0.3–1.2)
Total Protein: 7.6 g/dL (ref 6.5–8.1)

## 2022-10-19 LAB — CBC WITH DIFFERENTIAL/PLATELET
Abs Immature Granulocytes: 0.02 10*3/uL (ref 0.00–0.07)
Basophils Absolute: 0.1 10*3/uL (ref 0.0–0.1)
Basophils Relative: 1 %
Eosinophils Absolute: 0.2 10*3/uL (ref 0.0–0.5)
Eosinophils Relative: 2 %
HCT: 49.1 % (ref 39.0–52.0)
Hemoglobin: 16.4 g/dL (ref 13.0–17.0)
Immature Granulocytes: 0 %
Lymphocytes Relative: 42 %
Lymphs Abs: 3.5 10*3/uL (ref 0.7–4.0)
MCH: 28.9 pg (ref 26.0–34.0)
MCHC: 33.4 g/dL (ref 30.0–36.0)
MCV: 86.4 fL (ref 80.0–100.0)
Monocytes Absolute: 0.7 10*3/uL (ref 0.1–1.0)
Monocytes Relative: 9 %
Neutro Abs: 3.7 10*3/uL (ref 1.7–7.7)
Neutrophils Relative %: 46 %
Platelets: 304 10*3/uL (ref 150–400)
RBC: 5.68 MIL/uL (ref 4.22–5.81)
RDW: 12.1 % (ref 11.5–15.5)
WBC: 8.1 10*3/uL (ref 4.0–10.5)
nRBC: 0 % (ref 0.0–0.2)

## 2022-10-19 LAB — URINALYSIS, ROUTINE W REFLEX MICROSCOPIC
Bacteria, UA: NONE SEEN
Bilirubin Urine: NEGATIVE
Glucose, UA: NEGATIVE mg/dL
Ketones, ur: NEGATIVE mg/dL
Leukocytes,Ua: NEGATIVE
Nitrite: NEGATIVE
Protein, ur: NEGATIVE mg/dL
RBC / HPF: >50 RBC/hpf (ref 0–5)
Specific Gravity, Urine: 1.021 (ref 1.005–1.030)
Squamous Epithelial / HPF: NONE SEEN /HPF (ref 0–5)
pH: 6 (ref 5.0–8.0)

## 2022-10-19 MED ORDER — TAMSULOSIN HCL 0.4 MG PO CAPS: 0.4000 mg | ORAL_CAPSULE | Freq: Every day | ORAL | 0 refills | Status: AC

## 2022-10-19 MED ORDER — SODIUM CHLORIDE 0.9 % IV BOLUS: 1000.0000 mL | Freq: Once | INTRAVENOUS | Status: DC

## 2022-10-19 MED ORDER — OXYCODONE HCL 5 MG PO TABS: 5.0000 mg | ORAL_TABLET | Freq: Three times a day (TID) | ORAL | 0 refills | Status: DC | PRN

## 2022-10-19 MED ORDER — KETOROLAC TROMETHAMINE 30 MG/ML IJ SOLN
30.0000 mg | Freq: Once | INTRAMUSCULAR | Status: AC
  Administered 2022-10-19: 30 mg via INTRAMUSCULAR
  Filled 2022-10-19: qty 1

## 2022-10-19 MED ORDER — OXYCODONE HCL 5 MG/5ML PO SOLN: 5.0000 mg | Freq: Four times a day (QID) | ORAL | 0 refills | Status: DC | PRN

## 2022-10-19 MED ORDER — KETOROLAC TROMETHAMINE 30 MG/ML IJ SOLN: 30.0000 mg | Freq: Once | INTRAMUSCULAR | Status: DC

## 2022-10-19 NOTE — Discharge Instructions (Signed)
Take acetaminophen 650 mg and ibuprofen 400 mg every 6 hours for pain.  Take with food. Take oxycodone for severe/breakthrough pain as needed.  Take Flomax daily as prescribed.  Fluids to stay well-hydrated.  Call Dr. Lonna Cobb for follow-up appointment.

## 2022-10-19 NOTE — ED Provider Notes (Signed)
[facility] Provider Note    Event Date/Time   First MD Initiated Contact with Patient 10/19/22 1958     (approximate)   History   Flank Pain (RIGHT sided flank pain that began last night; h/o kidney stones)   HPI  Jack Hicks is a 32 y.o. male   Past medical history of kidney stones who presents emergency department with right flank pain starting earlier today.  Intermittent severe.  Feels like his kidney stones in the past.  No dysuria frequency no fever or chills.  No chest pain or shortness of breath.  No testicular pain.  Independent Historian contributed to assessment above: Father is at bedside corroborates the info above.     Physical Exam   Triage Vital Signs: ED Triage Vitals  Enc Vitals Group     BP 10/19/22 1858 (!) 153/89     Pulse Rate 10/19/22 1858 97     Resp 10/19/22 1858 19     Temp 10/19/22 1858 98.6 F (37 C)     Temp Source 10/19/22 1858 Oral     SpO2 10/19/22 1858 98 %     Weight 10/19/22 1900 170 lb (77.1 kg)     Height 10/19/22 1900 5\' 8"  (1.727 m)     Head Circumference --      Peak Flow --      Pain Score 10/19/22 1900 5     Pain Loc --      Pain Edu? --      Excl. in GC? --     Most recent vital signs: Vitals:   10/19/22 1945 10/19/22 2031  BP: (!) 142/97   Pulse: 71   Resp:  18  Temp:    SpO2: 98%     General: Awake, no distress.  CV:  Good peripheral perfusion. Resp:  Normal effort.  Abd:  No distention.  Other:  Right CVA tenderness.  Soft nontender abdomen.  Afebrile nontoxic appearance.   ED Results / Procedures / Treatments   Labs (all labs ordered are listed, but only abnormal results are displayed) Labs Reviewed  COMPREHENSIVE METABOLIC PANEL - Abnormal; Notable for the following components:      Result Value   Glucose, Bld 111 (*)    All other components within normal limits  URINALYSIS, ROUTINE W REFLEX MICROSCOPIC - Abnormal; Notable for the following components:   Color, Urine  YELLOW (*)    APPearance CLEAR (*)    Hgb urine dipstick LARGE (*)    All other components within normal limits  CBC WITH DIFFERENTIAL/PLATELET     I ordered and reviewed the above labs they are notable for hemoglobin in the urine, normal creatinine function.  RADIOLOGY I independently reviewed and interpreted CT of the abdomen pelvis and see a right-sided ureteral stone   PROCEDURES:  Critical Care performed: No  Procedures   MEDICATIONS ORDERED IN ED: Medications  ketorolac (TORADOL) 30 MG/ML injection 30 mg (30 mg Intramuscular Given 10/19/22 2034)     IMPRESSION / MDM / ASSESSMENT AND PLAN / ED COURSE  I reviewed the triage vital signs and the nursing notes.                                Patient's presentation is most consistent with acute presentation with potential threat to life or bodily function.  Differential diagnosis includes, but is not limited to, urolithiasis, obstructive uropathy, urinary tract  infection, pyelonephritis, intra-abdominal infx like appendicitis   The patient is on the cardiac monitor to evaluate for evidence of arrhythmia and/or significant heart rate changes.  MDM: With history of kidney stones with renal colic on the right side found to have a small 3 mm ureterovesicular junction stone, normal renal function on testing, pain control with IV ketorolac, fluids, reassessment.  No urinary tract infection noted on UA.  Patient appears stable nontoxic.  Plan for pain medications, Flomax, and urology follow-up.        FINAL CLINICAL IMPRESSION(S) / ED DIAGNOSES   Final diagnoses:  Ureterolithiasis  Right flank pain     Rx / DC Orders   ED Discharge Orders          Ordered    tamsulosin (FLOMAX) 0.4 MG CAPS capsule  Daily        10/19/22 2001    oxyCODONE (ROXICODONE) 5 MG immediate release tablet  Every 8 hours PRN,   Status:  Discontinued        10/19/22 2001    oxyCODONE (ROXICODONE) 5 MG/5ML solution  Every 6 hours PRN         10/19/22 2019             Note:  This document was prepared using Dragon voice recognition software and may include unintentional dictation errors.    Pilar Jarvis, MD 10/19/22 2119

## 2022-10-20 ENCOUNTER — Other Ambulatory Visit: Payer: BC Managed Care – PPO

## 2022-10-20 DIAGNOSIS — Z9852 Vasectomy status: Secondary | ICD-10-CM | POA: Diagnosis not present

## 2022-10-21 LAB — POST-VAS SPERM EVALUATION,QUAL: Volume: 2.2 mL

## 2022-10-24 ENCOUNTER — Other Ambulatory Visit: Payer: Self-pay | Admitting: Internal Medicine

## 2022-10-26 NOTE — Telephone Encounter (Signed)
Requested Prescriptions  Pending Prescriptions Disp Refills   hydrOXYzine (ATARAX) 10 MG/5ML syrup [Pharmacy Med Name: HYDROXYZINE 10 MG/5 ML SYRUP] 240 mL 0    Sig: TAKE 12.5 MLS (25 MG TOTAL) BY MOUTH 3 (THREE) TIMES DAILY AS NEEDED.     Ear, Nose, and Throat:  Antihistamines 2 Passed - 10/24/2022  9:49 PM      Passed - Cr in normal range and within 360 days    Creat  Date Value Ref Range Status  11/15/2021 1.08 0.60 - 1.26 mg/dL Final   Creatinine, Ser  Date Value Ref Range Status  10/19/2022 0.83 0.61 - 1.24 mg/dL Final         Passed - Valid encounter within last 12 months    Recent Outpatient Visits           1 month ago Thinning hair   [place] [facility] [place], Salvadore Oxford, NP   5 months ago Eosinophilic esophagitis   [place] [facility] [place], Salvadore Oxford, NP   9 months ago [employer]   [place] [facility] [place], Salvadore Oxford, NP   11 months ago Encounter for general adult medical examination with abnormal findings   [place] [facility] [place], Salvadore Oxford, NP   1 year ago Acute nasopharyngitis   [place] [facility] [place], Salvadore Oxford, NP       Future Appointments             In 4 weeks Sam[place] Si, Salvadore Oxford, NP [place] [facility], [facility]

## 2022-11-23 ENCOUNTER — Encounter: Payer: BC Managed Care – PPO | Admitting: Internal Medicine

## 2022-11-28 ENCOUNTER — Ambulatory Visit (INDEPENDENT_AMBULATORY_CARE_PROVIDER_SITE_OTHER): Payer: BC Managed Care – PPO | Admitting: Internal Medicine

## 2022-11-28 ENCOUNTER — Encounter: Payer: Self-pay | Admitting: Internal Medicine

## 2022-11-28 VITALS — BP 128/76 | HR 90 | Temp 96.8°F | Ht 67.0 in | Wt 175.0 lb

## 2022-11-28 DIAGNOSIS — E663 Overweight: Secondary | ICD-10-CM

## 2022-11-28 DIAGNOSIS — Z0001 Encounter for general adult medical examination with abnormal findings: Secondary | ICD-10-CM | POA: Diagnosis not present

## 2022-11-28 DIAGNOSIS — E7800 Pure hypercholesterolemia, unspecified: Secondary | ICD-10-CM

## 2022-11-28 DIAGNOSIS — Z6827 Body mass index (BMI) 27.0-27.9, adult: Secondary | ICD-10-CM

## 2022-11-28 DIAGNOSIS — R7309 Other abnormal glucose: Secondary | ICD-10-CM | POA: Diagnosis not present

## 2022-11-28 NOTE — Assessment & Plan Note (Signed)
Encourage diet and exercise for weight loss 

## 2022-11-28 NOTE — Progress Notes (Signed)
Subjective:    Patient ID: Jack Hicks, male    DOB: 1990-12-23, 32 y.o.   MRN: [id number]  HPI  Patient presents to clinic today for his annual exam.  Flu: 08/2020 Tetanus: 05/2015 COVID: x 2 Dentist: biannually  Diet: He does eat meat. He consumes some fruits and veggies. He does eat some fried foods. He drinks mostly. Exercise: gym 4 x week  Review of Systems     Past Medical History:  Diagnosis Date   Allergy    Heart murmur    as child   History of chicken pox    Low testosterone    Thyroid disease    borderline Hypothyroidism     Current Outpatient Medications  Medication Sig Dispense Refill   clomiPHENE (CLOMID) 50 MG tablet Take 25 mg by mouth daily.     escitalopram (LEXAPRO) 10 MG tablet Take 1 tablet (10 mg total) by mouth daily. 90 tablet 1   hydrOXYzine (ATARAX) 10 MG/5ML syrup TAKE 12.5 MLS (25 MG TOTAL) BY MOUTH 3 (THREE) TIMES DAILY AS NEEDED. 240 mL 0   oxyCODONE (ROXICODONE) 5 MG/5ML solution Take 5 mLs (5 mg total) by mouth every 6 (six) hours as needed for up to 12 doses for severe pain or breakthrough pain. 60 mL 0   No current facility-administered medications for this visit.    No Known Allergies  Family History  Problem Relation Age of Onset   Hyperlipidemia Mother    Hypertension Mother    Hyperlipidemia Father    Hypertension Father    Healthy Sister    Hyperlipidemia Maternal Grandmother    Hypertension Maternal Grandmother    Prostate cancer Maternal Grandfather    Hyperlipidemia Maternal Grandfather    Stroke Maternal Grandfather    Hypertension Maternal Grandfather    Hyperlipidemia Paternal Grandmother    Hypertension Paternal Grandmother    Arthritis Paternal Grandfather    Hyperlipidemia Paternal Grandfather    Hypertension Paternal Grandfather     Social History   Socioeconomic History   Marital status: Married    Spouse name: Not on file   Number of children: Not on file   Years of education: Not on file    Highest education level: Not on file  Occupational History   Not on file  Tobacco Use   Smoking status: Never   Smokeless tobacco: Never  Vaping Use   Vaping status: Never Used  Substance and Sexual Activity   Alcohol use: Yes    Alcohol/week: 1.0 standard drink of alcohol    Types: 1 Standard drinks or equivalent per week   Drug use: No   Sexual activity: Yes  Other Topics Concern   Not on file  Social History Narrative   Not on file   Social Determinants of Health   Financial Resource Strain: Not on file  Food Insecurity: Not on file  Transportation Needs: Not on file  Physical Activity: Not on file  Stress: Not on file  Social Connections: Not on file  Intimate Partner Violence: Not on file     Constitutional: Pt reports fatigue. Denies fever, malaise, headache or abrupt weight changes.  HEENT: Denies eye pain, eye redness, ear pain, ringing in the ears, wax buildup, runny nose, nasal congestion, bloody nose, or sore throat. Respiratory: Denies difficulty breathing, shortness of breath, cough or sputum production.   Cardiovascular: Denies chest pain, chest tightness, palpitations or swelling in the hands or feet.  Gastrointestinal: Denies abdominal pain, bloating, constipation, diarrhea or  blood in the stool.  GU: Denies urgency, frequency, pain with urination, burning sensation, blood in urine, odor or discharge. Musculoskeletal: Denies decrease in range of motion, difficulty with gait, muscle pain or joint pain and swelling.  Skin: Pt reports thinning hair. Denies redness, rashes, lesions or ulcercations.  Neurological: Denies dizziness, difficulty with memory, difficulty with speech or problems with balance and coordination.  Psych: Patient has a history of anxiety.  Denies depression, SI/HI.  No other specific complaints in a complete review of systems (except as listed in HPI above).  Objective:   Physical Exam   BP 128/76 (BP Location: Left Arm, Patient  Position: Sitting, Cuff Size: Normal)   Pulse 90   Temp (!) 96.8 F (36 C) (Temporal)   Ht 5\' 7"  (1.702 m)   Wt 175 lb (79.4 kg)   SpO2 97%   BMI 27.41 kg/m   Wt Readings from Last 3 Encounters:  10/19/22 170 lb (77.1 kg)  09/25/22 174 lb (78.9 kg)  07/20/22 165 lb (74.8 kg)    General: Appears his stated age, overweight, in NAD. Skin: Warm, dry and intact.  HEENT: Head: normal shape and size; Eyes: sclera white, no icterus, conjunctiva pink, PERRLA and EOMs intact;  Neck:  Neck supple, trachea midline. No masses, lumps or thyromegaly present.  Cardiovascular: Normal rate and rhythm. S1,S2 noted.  No murmur, rubs or gallops noted. No JVD or BLE edema.  Pulmonary/Chest: Normal effort and positive vesicular breath sounds. No respiratory distress. No wheezes, rales or ronchi noted.  Abdomen: Normal bowel sounds.  Musculoskeletal: Strength 5/5 BUE/BLE. No difficulty with gait.  Neurological: Alert and oriented. Cranial nerves II-XII grossly intact. Coordination normal.  Psychiatric: Mood and affect normal. Behavior is normal. Judgment and thought content normal.    BMET    Component Value Date/Time   NA 136 10/19/2022 1909   K 3.8 10/19/2022 1909   CL 102 10/19/2022 1909   CO2 23 10/19/2022 1909   GLUCOSE 111 (H) 10/19/2022 1909   BUN 18 10/19/2022 1909   CREATININE 0.83 10/19/2022 1909   CREATININE 1.08 11/15/2021 1114   CALCIUM 9.0 10/19/2022 1909   GFRNONAA >60 10/19/2022 1909   GFRAA >60 04/25/2019 0817    Lipid Panel     Component Value Date/Time   CHOL 178 11/15/2021 1114   TRIG 106 11/15/2021 1114   HDL 52 11/15/2021 1114   CHOLHDL 3.4 11/15/2021 1114   VLDL 19.4 07/03/2019 1204   LDLCALC 106 (H) 11/15/2021 1114    CBC    Component Value Date/Time   WBC 8.1 10/19/2022 1909   RBC 5.68 10/19/2022 1909   HGB 16.4 10/19/2022 1909   HCT 49.1 10/19/2022 1909   PLT 304 10/19/2022 1909   MCV 86.4 10/19/2022 1909   MCH 28.9 10/19/2022 1909   MCHC 33.4  10/19/2022 1909   RDW 12.1 10/19/2022 1909   LYMPHSABS 3.5 10/19/2022 1909   MONOABS 0.7 10/19/2022 1909   EOSABS 0.2 10/19/2022 1909   BASOSABS 0.1 10/19/2022 1909    Hgb A1C Lab Results  Component Value Date   HGBA1C 5.5 09/22/2020           Assessment & Plan:   Preventative health maintenance:  Encouraged him to get a flu shot in the fall Tetanus UTD Encouraged him to get his COVID-vaccine Encouraged him to consume a balanced diet and exercise regimen Advised him to see a dentist annually We will check lipid, A1c today  RTC in 6 months, follow-up chronic  conditions Nicki Reaper, NP

## 2022-11-28 NOTE — Patient Instructions (Signed)
Health Maintenance, Male Adopting a healthy lifestyle and getting preventive care are important in promoting health and wellness. Ask your health care provider about: The right schedule for you to have regular tests and exams. Things you can do on your own to prevent diseases and keep yourself healthy. What should I know about diet, weight, and exercise? Eat a healthy diet  Eat a diet that includes plenty of vegetables, fruits, low-fat dairy products, and lean protein. Do not eat a lot of foods that are high in solid fats, added sugars, or sodium. Maintain a healthy weight Body mass index (BMI) is a measurement that can be used to identify possible weight problems. It estimates body fat based on height and weight. Your health care provider can help determine your BMI and help you achieve or maintain a healthy weight. Get regular exercise Get regular exercise. This is one of the most important things you can do for your health. Most adults should: Exercise for at least 150 minutes each week. The exercise should increase your heart rate and make you sweat (moderate-intensity exercise). Do strengthening exercises at least twice a week. This is in addition to the moderate-intensity exercise. Spend less time sitting. Even light physical activity can be beneficial. Watch cholesterol and blood lipids Have your blood tested for lipids and cholesterol at 32 years of age, then have this test every 5 years. You may need to have your cholesterol levels checked more often if: Your lipid or cholesterol levels are high. You are older than 32 years of age. You are at high risk for heart disease. What should I know about cancer screening? Many types of cancers can be detected early and may often be prevented. Depending on your health history and family history, you may need to have cancer screening at various ages. This may include screening for: Colorectal cancer. Prostate cancer. Skin cancer. Lung  cancer. What should I know about heart disease, diabetes, and high blood pressure? Blood pressure and heart disease High blood pressure causes heart disease and increases the risk of stroke. This is more likely to develop in people who have high blood pressure readings or are overweight. Talk with your health care provider about your target blood pressure readings. Have your blood pressure checked: Every 3-5 years if you are 18-39 years of age. Every year if you are 40 years old or older. If you are between the ages of 65 and 75 and are a current or former smoker, ask your health care provider if you should have a one-time screening for abdominal aortic aneurysm (AAA). Diabetes Have regular diabetes screenings. This checks your fasting blood sugar level. Have the screening done: Once every three years after age 45 if you are at a normal weight and have a low risk for diabetes. More often and at a younger age if you are overweight or have a high risk for diabetes. What should I know about preventing infection? Hepatitis B If you have a higher risk for hepatitis B, you should be screened for this virus. Talk with your health care provider to find out if you are at risk for hepatitis B infection. Hepatitis C Blood testing is recommended for: Everyone born from 1945 through 1965. Anyone with known risk factors for hepatitis C. Sexually transmitted infections (STIs) You should be screened each year for STIs, including gonorrhea and chlamydia, if: You are sexually active and are younger than 32 years of age. You are older than 32 years of age and your   health care provider tells you that you are at risk for this type of infection. Your sexual activity has changed since you were last screened, and you are at increased risk for chlamydia or gonorrhea. Ask your health care provider if you are at risk. Ask your health care provider about whether you are at high risk for HIV. Your health care provider  may recommend a prescription medicine to help prevent HIV infection. If you choose to take medicine to prevent HIV, you should first get tested for HIV. You should then be tested every 3 months for as long as you are taking the medicine. Follow these instructions at home: Alcohol use Do not drink alcohol if your health care provider tells you not to drink. If you drink alcohol: Limit how much you have to 0-2 drinks a day. Know how much alcohol is in your drink. In the [place] one drink equals one 12 oz bottle of beer (355 mL), one 5 oz glass of wine (148 mL), or one 1 oz glass of hard liquor (44 mL). Lifestyle Do not use any products that contain nicotine or tobacco. These products include cigarettes, chewing tobacco, and vaping devices, such as e-cigarettes. If you need help quitting, ask your health care provider. Do not use street drugs. Do not share needles. Ask your health care provider for help if you need support or information about quitting drugs. General instructions Schedule regular health, dental, and eye exams. Stay current with your vaccines. Tell your health care provider if: You often feel depressed. You have ever been abused or do not feel safe at home. Summary Adopting a healthy lifestyle and getting preventive care are important in promoting health and wellness. Follow your health care provider's instructions about healthy diet, exercising, and getting tested or screened for diseases. Follow your health care provider's instructions on monitoring your cholesterol and blood pressure. This information is not intended to replace advice given to you by your health care provider. Make sure you discuss any questions you have with your health care provider. Document Revised: 08/23/2020 Document Reviewed: 08/23/2020 Elsevier Patient Education  2024 [employer] Inc.  

## 2022-12-20 DIAGNOSIS — L648 Other androgenic alopecia: Secondary | ICD-10-CM | POA: Diagnosis not present

## 2022-12-20 DIAGNOSIS — L218 Other seborrheic dermatitis: Secondary | ICD-10-CM | POA: Diagnosis not present

## 2022-12-26 DIAGNOSIS — M7632 Iliotibial band syndrome, left leg: Secondary | ICD-10-CM | POA: Diagnosis not present

## 2023-01-24 DIAGNOSIS — R7989 Other specified abnormal findings of blood chemistry: Secondary | ICD-10-CM | POA: Diagnosis not present

## 2023-01-24 DIAGNOSIS — E291 Testicular hypofunction: Secondary | ICD-10-CM | POA: Diagnosis not present

## 2023-02-23 ENCOUNTER — Ambulatory Visit: Payer: BC Managed Care – PPO | Admitting: Internal Medicine

## 2023-03-01 ENCOUNTER — Ambulatory Visit: Payer: BC Managed Care – PPO | Admitting: Internal Medicine

## 2023-03-01 VITALS — BP 116/76 | HR 73 | Temp 98.3°F | Ht 67.0 in | Wt 175.8 lb

## 2023-03-01 DIAGNOSIS — R519 Headache, unspecified: Secondary | ICD-10-CM

## 2023-03-01 DIAGNOSIS — R042 Hemoptysis: Secondary | ICD-10-CM

## 2023-03-01 DIAGNOSIS — Z23 Encounter for immunization: Secondary | ICD-10-CM

## 2023-03-01 MED ORDER — AZITHROMYCIN 200 MG/5ML PO SUSR: ORAL | 0 refills | Status: AC

## 2023-03-01 NOTE — Patient Instructions (Signed)

## 2023-03-01 NOTE — Progress Notes (Signed)
Subjective:    Patient ID: Jack Hicks, male    DOB: January 01, 1991, 32 y.o.   MRN: [id number]  HPI  Discussed the use of AI scribe software for clinical note transcription with the patient, who gave verbal consent to proceed.    The patient, presented with a chief complaint of coughing up blood since the morning of the consultation. He reported two instances of coughing up bright red blood and one instance of coughing up dry blood that resembled a clot. The patient also reported some chest discomfort, described as tightness, which seemed to improve after eating lunch. He denied any fever, chills, or body aches. The patient has not taken any over-the-counter medications for his symptoms.The patient's household has had recent cases of pneumonia, with his son diagnosed two weeks prior to the consultation and his baby diagnosed a few days prior. The patient has not had any known exposure to tuberculosis.  In addition to the hemoptysis, the patient has been experiencing headaches for approximately a month, which have been progressively worsening. He attempted a detox of caffeine and gym supplements, which provided some relief. However, on the day of the consultation, the headache returned, described as different from the previous tension headaches, and more akin to a pre-fever headache.  Regarding his headaches, the patient reported no correlation with gym activities or any particular stressors. He has been experiencing these headaches in two locations: the back of the head and the front. The patient denied any soreness or aching in the cervical spine.    Review of Systems   Past Medical History:  Diagnosis Date   Allergy    Heart murmur    as child   History of chicken pox    Low testosterone    Thyroid disease    borderline Hypothyroidism     Current Outpatient Medications  Medication Sig Dispense Refill   clomiPHENE (CLOMID) 50 MG tablet Take 25 mg by mouth daily.     hydrOXYzine  (ATARAX) 10 MG/5ML syrup TAKE 12.5 MLS (25 MG TOTAL) BY MOUTH 3 (THREE) TIMES DAILY AS NEEDED. 240 mL 0   oxyCODONE (ROXICODONE) 5 MG/5ML solution Take 5 mLs (5 mg total) by mouth every 6 (six) hours as needed for up to 12 doses for severe pain or breakthrough pain. (Patient not taking: Reported on 11/28/2022) 60 mL 0   No current facility-administered medications for this visit.    No Known Allergies  Family History  Problem Relation Age of Onset   Hyperlipidemia Mother    Hypertension Mother    Hyperlipidemia Father    Hypertension Father    Healthy Sister    Hyperlipidemia Maternal Grandmother    Hypertension Maternal Grandmother    Prostate cancer Maternal Grandfather    Hyperlipidemia Maternal Grandfather    Stroke Maternal Grandfather    Hypertension Maternal Grandfather    Parkinson's disease Maternal Grandfather    Hyperlipidemia Paternal Grandmother    Hypertension Paternal Grandmother    Arthritis Paternal Grandfather    Hyperlipidemia Paternal Grandfather    Hypertension Paternal Grandfather     Social History   Socioeconomic History   Marital status: Married    Spouse name: Not on file   Number of children: Not on file   Years of education: Not on file   Highest education level: Bachelor's degree (e.g., BA, AB, BS)  Occupational History   Not on file  Tobacco Use   Smoking status: Never   Smokeless tobacco: Never  Vaping Use  Vaping status: Never Used  Substance and Sexual Activity   Alcohol use: Yes    Alcohol/week: 1.0 standard drink of alcohol    Types: 1 Standard drinks or equivalent per week   Drug use: No   Sexual activity: Yes  Other Topics Concern   Not on file  Social History Narrative   Not on file   Social Determinants of Health   Financial Resource Strain: Low Risk  (03/01/2023)   Overall Financial Resource Strain (CARDIA)    Difficulty of Paying Living Expenses: Not hard at all  Food Insecurity: No Food Insecurity (03/01/2023)    Hunger Vital Sign    Worried About Running Out of Food in the Last Year: Never true    Ran Out of Food in the Last Year: Never true  Transportation Needs: No Transportation Needs (03/01/2023)   PRAPARE - Administrator, Civil Service (Medical): No    Lack of Transportation (Non-Medical): No  Physical Activity: Sufficiently Active (03/01/2023)   Exercise Vital Sign    Days of Exercise per Week: 3 days    Minutes of Exercise per Session: 50 min  Stress: No Stress Concern Present (03/01/2023)   [employer] of Occupational Health - Occupational Stress Questionnaire    Feeling of Stress : Only a little  Social Connections: Moderately Isolated (03/01/2023)   Social Connection and Isolation Panel [NHANES]    Frequency of Communication with Friends and Family: More than three times a week    Frequency of Social Gatherings with Friends and Family: More than three times a week    Attends Religious Services: Never    Database administrator or Organizations: No    Attends Engineer, structural: Not on file    Marital Status: Married  Catering manager Violence: Not on file     Constitutional: Patient reports frequent headaches.  Denies fever, malaise, fatigue, or abrupt weight changes.  HEENT: Denies eye pain, eye redness, ear pain, ringing in the ears, wax buildup, runny nose, nasal congestion, bloody nose, or sore throat. Respiratory: Patient reports cough with bloody sputum.  Denies difficulty breathing, shortness of breath.   Cardiovascular: Patient reports chest tightness.  Denies chest pain, palpitations or swelling in the hands or feet.  Gastrointestinal: Denies abdominal pain, bloating, constipation, diarrhea or blood in the stool.  GU: Denies urgency, frequency, pain with urination, burning sensation, blood in urine, odor or discharge. Musculoskeletal: Denies decrease in range of motion, difficulty with gait, muscle pain or joint pain and swelling.  Skin: Denies  redness, rashes, lesions or ulcercations.  Neurological: Denies dizziness, difficulty with memory, difficulty with speech or problems with balance and coordination.  Psych: Denies anxiety, depression, SI/HI.  No other specific complaints in a complete review of systems (except as listed in HPI above).      Objective:   Physical Exam BP 116/76   Pulse 73   Temp 98.3 F (36.8 C)   Ht 5\' 7"  (1.702 m)   Wt 175 lb 12.8 oz (79.7 kg)   SpO2 98%   BMI 27.53 kg/m   Wt Readings from Last 3 Encounters:  11/28/22 175 lb (79.4 kg)  10/19/22 170 lb (77.1 kg)  09/25/22 174 lb (78.9 kg)    General: Appears his stated age, overweight, in NAD. Skin: Warm, dry and intact. No rashes noted. HEENT: Head: normal shape and size, no sinus tenderness noted; Eyes: sclera white, no icterus, conjunctiva pink, PERRLA and EOMs intact; Nose: mucosa pink and moist,  septum midline; Throat/Mouth: Teeth present, mucosa pink and moist, no exudate, lesions or ulcerations noted.  Neck: No adenopathy noted. Cardiovascular: Normal rate and rhythm. S1,S2 noted.  No murmur, rubs or gallops noted.  Pulmonary/Chest: Normal effort and positive vesicular breath sounds but diminished in the right lower lobe. No respiratory distress. No wheezes, rales or ronchi noted.  Musculoskeletal: No difficulty with gait.  Neurological: Alert and oriented. Coordination normal.   BMET    Component Value Date/Time   NA 136 10/19/2022 1909   K 3.8 10/19/2022 1909   CL 102 10/19/2022 1909   CO2 23 10/19/2022 1909   GLUCOSE 111 (H) 10/19/2022 1909   BUN 18 10/19/2022 1909   CREATININE 0.83 10/19/2022 1909   CREATININE 1.08 11/15/2021 1114   CALCIUM 9.0 10/19/2022 1909   GFRNONAA >60 10/19/2022 1909   GFRAA >60 04/25/2019 0817    Lipid Panel     Component Value Date/Time   CHOL 202 (H) 11/28/2022 1352   TRIG 155 (H) 11/28/2022 1352   HDL 45 11/28/2022 1352   CHOLHDL 4.5 11/28/2022 1352   VLDL 19.4 07/03/2019 1204   LDLCALC  130 (H) 11/28/2022 1352    CBC    Component Value Date/Time   WBC 8.1 10/19/2022 1909   RBC 5.68 10/19/2022 1909   HGB 16.4 10/19/2022 1909   HCT 49.1 10/19/2022 1909   PLT 304 10/19/2022 1909   MCV 86.4 10/19/2022 1909   MCH 28.9 10/19/2022 1909   MCHC 33.4 10/19/2022 1909   RDW 12.1 10/19/2022 1909   LYMPHSABS 3.5 10/19/2022 1909   MONOABS 0.7 10/19/2022 1909   EOSABS 0.2 10/19/2022 1909   BASOSABS 0.1 10/19/2022 1909    Hgb A1C Lab Results  Component Value Date   HGBA1C 5.7 (H) 11/28/2022            Assessment & Plan:   Assessment and Plan    Hemoptysis, Exposure to CAP: New onset of coughing up blood, both dry and bright red. No history of TB exposure. Recent exposure to pneumonia in the household. Diminished breath sounds in the right lower lobe on examination. -Start Zithromax (Z-Pak) to treat potential community-acquired pneumonia. -If symptoms persist or worsen, consider chest x-ray and TB Gold assay.   Tension Headache Chronic tension headaches, likely related to stress. -Continue monitoring symptoms and notify me if worsens.   RTC in 3 months for follow-up of chronic conditions Nicki Reaper, NP

## 2023-03-02 ENCOUNTER — Encounter: Payer: Self-pay | Admitting: Internal Medicine

## 2023-03-05 MED ORDER — HYDROXYZINE HCL 10 MG/5ML PO SYRP: 25.0000 mg | ORAL_SOLUTION | Freq: Three times a day (TID) | ORAL | 0 refills | Status: DC | PRN

## 2023-03-30 ENCOUNTER — Ambulatory Visit: Payer: Self-pay

## 2023-03-30 ENCOUNTER — Other Ambulatory Visit
Admission: RE | Admit: 2023-03-30 | Discharge: 2023-03-30 | Disposition: A | Discharge status: Home / Self Care | Payer: BC Managed Care – PPO | Source: Ambulatory Visit | Attending: Family Medicine | Admitting: Family Medicine

## 2023-03-30 DIAGNOSIS — R042 Hemoptysis: Secondary | ICD-10-CM | POA: Insufficient documentation

## 2023-03-30 LAB — D-DIMER, QUANTITATIVE: D-Dimer, Quant: <0.27 ug{FEU}/mL (ref 0.00–0.50)

## 2023-03-30 NOTE — Telephone Encounter (Signed)
    Chief Complaint: Coughing up bright red blood. Seen in office 03/01/23 with pneumonia. No availability per [employer]ras in practice. Symptoms: Above, chest feels tight. Frequency: Yesterday. Pertinent Negatives: Patient denies fever Disposition: [] ED /[x] Urgent Care (no appt availability in office) / [] Appointment(In office/virtual)/ []  [facility]/ [] Home Care/ [] Refused Recommended Disposition /[]  Mobile Bus/ []  Follow-up with PCP Additional Notes: Pt. Going to UC.  Reason for Disposition  Coughing up rusty-colored (reddish-brown) sputum  Answer Assessment - Initial Assessment Questions 1. ONSET: "When did the cough begin?"      Today 2. SEVERITY: "How bad is the cough today?"      Mild-moderate 3. SPUTUM: "Describe the color of your sputum" (none, dry cough; clear, white, yellow, green)     Blood 4. HEMOPTYSIS: "Are you coughing up any blood?" If so ask: "How much?" (flecks, streaks, tablespoons, etc.)     Yes 5. DIFFICULTY BREATHING: "Are you having difficulty breathing?" If Yes, ask: "How bad is it?" (e.g., mild, moderate, severe)    - MILD: No SOB at rest, mild SOB with walking, speaks normally in sentences, can lie down, no retractions, pulse < 100.    - MODERATE: SOB at rest, SOB with minimal exertion and prefers to sit, cannot lie down flat, speaks in phrases, mild retractions, audible wheezing, pulse 100-120.    - SEVERE: Very SOB at rest, speaks in single words, struggling to breathe, sitting hunched forward, retractions, pulse > 120      No 6. FEVER: "Do you have a fever?" If Yes, ask: "What is your temperature, how was it measured, and when did it start?"     No 7. CARDIAC HISTORY: "Do you have any history of heart disease?" (e.g., heart attack, congestive heart failure)      No 8. LUNG HISTORY: "Do you have any history of lung disease?"  (e.g., pulmonary embolus, asthma, emphysema)     Pneumonia 9. PE RISK FACTORS: "Do you have a history of blood  clots?" (or: recent major surgery, recent prolonged travel, bedridden)     No 10. OTHER SYMPTOMS: "Do you have any other symptoms?" (e.g., runny nose, wheezing, chest pain)       Blood from sinus 11. PREGNANCY: "Is there any chance you are pregnant?" "When was your last menstrual period?"       N/a 12. TRAVEL: "Have you traveled out of the country in the last month?" (e.g., travel history, exposures)       No  Protocols used: Cough - Acute Productive-A-AH

## 2023-04-03 ENCOUNTER — Encounter: Payer: Self-pay | Admitting: Internal Medicine

## 2023-04-03 ENCOUNTER — Ambulatory Visit: Payer: BC Managed Care – PPO | Admitting: Internal Medicine

## 2023-04-03 VITALS — BP 136/84 | HR 85 | Ht 67.0 in | Wt 174.0 lb

## 2023-04-03 DIAGNOSIS — R7303 Prediabetes: Secondary | ICD-10-CM

## 2023-04-03 DIAGNOSIS — N528 Other male erectile dysfunction: Secondary | ICD-10-CM | POA: Diagnosis not present

## 2023-04-03 DIAGNOSIS — E7800 Pure hypercholesterolemia, unspecified: Secondary | ICD-10-CM

## 2023-04-03 DIAGNOSIS — E349 Endocrine disorder, unspecified: Secondary | ICD-10-CM

## 2023-04-03 NOTE — Progress Notes (Signed)
Subjective:    Patient ID: Jack Hicks, male    DOB: 12/17/1990, 32 y.o.   MRN: [id number]  HPI  Discussed the use of AI scribe software for clinical note transcription with the patient, who gave verbal consent to proceed.  The patient, with a history of hyperlipidemia, and anxiety, presents with concerns about a recent episode of decrease in sexual drive and a change in erectile function. He notes that while he has no issues achieving an erection, it subsides quickly during periods of inactivity, which was not the case previously. The patient's spouse has noticed this change and expressed concern, leading the patient to research potential causes. He is aware that cholesterol, testosterone levels, and certain medications can affect erectile function, and is interested in exploring these possibilities. The patient's last testosterone check was six months ago.  He has been taking Clomid intermittently for hypotestosteronism and notes that when he is consistent with this medication, his drive increases but he also becomes more irritable and impatient. The patient expresses concern about this change in mood, particularly when interacting with his children in the evenings.  Review of Systems   Past Medical History:  Diagnosis Date   Allergy    Heart murmur    as child   History of chicken pox    Low testosterone    Thyroid disease    borderline Hypothyroidism     Current Outpatient Medications  Medication Sig Dispense Refill   hydrOXYzine (ATARAX) 10 MG/5ML syrup Take 12.5 mLs (25 mg total) by mouth 3 (three) times daily as needed. 240 mL 0   ketoconazole (NIZORAL) 2 % shampoo SHAMPOO (2-3 TIMES) WEEKLY. APPLY, LATHER - LEAVE ON FOR 5 MIN BEFORE RINSING.     oxyCODONE (ROXICODONE) 5 MG/5ML solution Take 5 mLs (5 mg total) by mouth every 6 (six) hours as needed for up to 12 doses for severe pain or breakthrough pain. 60 mL 0   No current facility-administered medications for this  visit.    No Known Allergies  Family History  Problem Relation Age of Onset   Hyperlipidemia Mother    Hypertension Mother    Hyperlipidemia Father    Hypertension Father    Healthy Sister    Hyperlipidemia Maternal Grandmother    Hypertension Maternal Grandmother    Prostate cancer Maternal Grandfather    Hyperlipidemia Maternal Grandfather    Stroke Maternal Grandfather    Hypertension Maternal Grandfather    Parkinson's disease Maternal Grandfather    Hyperlipidemia Paternal Grandmother    Hypertension Paternal Grandmother    Arthritis Paternal Grandfather    Hyperlipidemia Paternal Grandfather    Hypertension Paternal Grandfather     Social History   Socioeconomic History   Marital status: Married    Spouse name: Not on file   Number of children: Not on file   Years of education: Not on file   Highest education level: Bachelor's degree (e.g., BA, AB, BS)  Occupational History   Not on file  Tobacco Use   Smoking status: Never   Smokeless tobacco: Never  Vaping Use   Vaping status: Never Used  Substance and Sexual Activity   Alcohol use: Yes    Alcohol/week: 1.0 standard drink of alcohol    Types: 1 Standard drinks or equivalent per week   Drug use: No   Sexual activity: Yes  Other Topics Concern   Not on file  Social History Narrative   Not on file   Social Drivers of Health  Financial Resource Strain: Low Risk  (03/30/2023)   Received from [facility]   Overall Financial Resource Strain (CARDIA)    Difficulty of Paying Living Expenses: Not very hard  Food Insecurity: No Food Insecurity (03/30/2023)   Received from [facility]   Hunger Vital Sign    Worried About Running Out of Food in the Last Year: Never true    Ran Out of Food in the Last Year: Never true  Transportation Needs: No Transportation Needs (03/30/2023)   Received from [facility] - Transportation    In the past 12  months, has lack of transportation kept you from medical appointments or from getting medications?: No    Lack of Transportation (Non-Medical): No  Physical Activity: Sufficiently Active (03/01/2023)   Exercise Vital Sign    Days of Exercise per Week: 3 days    Minutes of Exercise per Session: 50 min  Stress: No Stress Concern Present (03/01/2023)   [employer] of Occupational Health - Occupational Stress Questionnaire    Feeling of Stress : Only a little  Social Connections: Moderately Isolated (03/01/2023)   Social Connection and Isolation Panel [NHANES]    Frequency of Communication with Friends and Family: More than three times a week    Frequency of Social Gatherings with Friends and Family: More than three times a week    Attends Religious Services: Never    Database administrator or Organizations: No    Attends Engineer, structural: Not on file    Marital Status: Married  Catering manager Violence: Not on file     Constitutional: Denies fever, malaise, fatigue, headache or abrupt weight changes.  Respiratory: Denies difficulty breathing, shortness of breath, cough or sputum production.   Cardiovascular: Denies chest pain, chest tightness, palpitations or swelling in the hands or feet.  Gastrointestinal: Denies abdominal pain, bloating, constipation, diarrhea or blood in the stool.  GU: Patient reports difficulty with erections.  Denies urgency, frequency, pain with urination, burning sensation, blood in urine, odor or discharge. Musculoskeletal: Denies decrease in range of motion, difficulty with gait, muscle pain or joint pain and swelling.  Skin: Denies redness, rashes, lesions or ulcercations.  Neurological: Denies dizziness, difficulty with memory, difficulty with speech or problems with balance and coordination.  Psych: Patient has a history of anxiety, reports irritability.  Denies depression, SI/HI.  No other specific complaints in a complete review of  systems (except as listed in HPI above).      Objective:   Physical Exam  BP 136/84   Pulse 85   Ht 5\' 7"  (1.702 m)   Wt 174 lb (78.9 kg)   SpO2 95%   BMI 27.25 kg/m   Wt Readings from Last 3 Encounters:  03/01/23 175 lb 12.8 oz (79.7 kg)  11/28/22 175 lb (79.4 kg)  10/19/22 170 lb (77.1 kg)    General: Appears his stated age, overweight, in NAD. Skin: Warm, dry and intact.  Cardiovascular: Normal rate and rhythm. S1,S2 noted.  No murmur, rubs or gallops noted.  Pulmonary/Chest: Normal effort and positive vesicular breath sounds. No respiratory distress. No wheezes, rales or ronchi noted.  GU: Deferred.   Neurological: Alert and oriented.   BMET    Component Value Date/Time   NA 136 10/19/2022 1909   K 3.8 10/19/2022 1909   CL 102 10/19/2022 1909   CO2 23 10/19/2022 1909   GLUCOSE 111 (H) 10/19/2022 1909   BUN 18 10/19/2022  1909   CREATININE 0.83 10/19/2022 1909   CREATININE 1.08 11/15/2021 1114   CALCIUM 9.0 10/19/2022 1909   GFRNONAA >60 10/19/2022 1909   GFRAA >60 04/25/2019 0817    Lipid Panel     Component Value Date/Time   CHOL 202 (H) 11/28/2022 1352   TRIG 155 (H) 11/28/2022 1352   HDL 45 11/28/2022 1352   CHOLHDL 4.5 11/28/2022 1352   VLDL 19.4 07/03/2019 1204   LDLCALC 130 (H) 11/28/2022 1352    CBC    Component Value Date/Time   WBC 8.1 10/19/2022 1909   RBC 5.68 10/19/2022 1909   HGB 16.4 10/19/2022 1909   HCT 49.1 10/19/2022 1909   PLT 304 10/19/2022 1909   MCV 86.4 10/19/2022 1909   MCH 28.9 10/19/2022 1909   MCHC 33.4 10/19/2022 1909   RDW 12.1 10/19/2022 1909   LYMPHSABS 3.5 10/19/2022 1909   MONOABS 0.7 10/19/2022 1909   EOSABS 0.2 10/19/2022 1909   BASOSABS 0.1 10/19/2022 1909    Hgb A1C Lab Results  Component Value Date   HGBA1C 5.7 (H) 11/28/2022            Assessment & Plan:   Assessment and Plan     Hyperlipidemia Last cholesterol check in August showed elevated levels. Discussed potential impact on  erectile function. -Continue current management.  Erectile Dysfunction Reports decreased drive and rapid detumescence between sexual activities. No issues with achieving erection. Recent inconsistent use of Clomid, which when taken regularly, appears to improve drive but also increases irritability. -Order full panel blood work including CBC, liver, kidney function and testosterone.  Prediabetes Last A1c showed prediabetic range. -Include A1c in the ordered blood work.   RTC in 2 months for follow-up of chronic conditions

## 2023-04-03 NOTE — Patient Instructions (Signed)

## 2023-04-04 ENCOUNTER — Encounter: Payer: Self-pay | Admitting: Internal Medicine

## 2023-04-04 LAB — CBC
HCT: 47.8 % (ref 38.5–50.0)
Hemoglobin: 16.3 g/dL (ref 13.2–17.1)
MCH: 29.5 pg (ref 27.0–33.0)
MCHC: 34.1 g/dL (ref 32.0–36.0)
MCV: 86.6 fL (ref 80.0–100.0)
MPV: 9.8 fL (ref 7.5–12.5)
Platelets: 289 10*3/uL (ref 140–400)
RBC: 5.52 10*6/uL (ref 4.20–5.80)
RDW: 12.7 % (ref 11.0–15.0)
WBC: 6.8 10*3/uL (ref 3.8–10.8)

## 2023-04-04 LAB — TESTOSTERONE TOTAL,FREE,BIO, MALES
Albumin: 4.5 g/dL (ref 3.6–5.1)
Sex Hormone Binding: 23 nmol/L (ref 10–50)
Testosterone, Bioavailable: 144.7 ng/dL (ref 110.0–575.0)
Testosterone, Free: 70.4 pg/mL (ref 46.0–224.0)
Testosterone: 405 ng/dL (ref 250–827)

## 2023-04-04 LAB — COMPLETE METABOLIC PANEL WITH GFR
AG Ratio: 1.6 (calc) (ref 1.0–2.5)
ALT: 19 U/L (ref 9–46)
AST: 18 U/L (ref 10–40)
Albumin: 4.5 g/dL (ref 3.6–5.1)
Alkaline phosphatase (APISO): 86 U/L (ref 36–130)
BUN: 18 mg/dL (ref 7–25)
CO2: 28 mmol/L (ref 20–32)
Calcium: 9.8 mg/dL (ref 8.6–10.3)
Chloride: 103 mmol/L (ref 98–110)
Creat: 0.81 mg/dL (ref 0.60–1.26)
Globulin: 2.9 g/dL (ref 1.9–3.7)
Glucose, Bld: 80 mg/dL (ref 65–139)
Potassium: 4.2 mmol/L (ref 3.5–5.3)
Sodium: 139 mmol/L (ref 135–146)
Total Bilirubin: 0.4 mg/dL (ref 0.2–1.2)
Total Protein: 7.4 g/dL (ref 6.1–8.1)
eGFR: 120 mL/min/{1.73_m2} (ref >=60)

## 2023-04-04 LAB — LIPID PANEL
Cholesterol: 197 mg/dL (ref <200)
HDL: 52 mg/dL (ref >=40)
LDL Cholesterol (Calc): 117 mg/dL — ABNORMAL HIGH
Non-HDL Cholesterol (Calc): 145 mg/dL — ABNORMAL HIGH (ref <130)
Total CHOL/HDL Ratio: 3.8 (calc) (ref <5.0)
Triglycerides: 160 mg/dL — ABNORMAL HIGH (ref <150)

## 2023-04-04 LAB — HEMOGLOBIN A1C
Hgb A1c MFr Bld: 5.7 %{Hb} — ABNORMAL HIGH (ref <5.7)
Mean Plasma Glucose: 117 mg/dL
eAG (mmol/L): 6.5 mmol/L

## 2023-04-12 ENCOUNTER — Ambulatory Visit: Payer: BC Managed Care – PPO | Admitting: Internal Medicine

## 2023-04-12 DIAGNOSIS — R7989 Other specified abnormal findings of blood chemistry: Secondary | ICD-10-CM | POA: Diagnosis not present

## 2023-05-09 ENCOUNTER — Ambulatory Visit: Payer: Self-pay | Admitting: Internal Medicine

## 2023-05-09 ENCOUNTER — Encounter: Payer: Self-pay | Admitting: Internal Medicine

## 2023-05-09 VITALS — BP 138/82 | HR 95 | Ht 67.0 in | Wt 176.8 lb

## 2023-05-09 DIAGNOSIS — K5900 Constipation, unspecified: Secondary | ICD-10-CM

## 2023-05-09 DIAGNOSIS — R1013 Epigastric pain: Secondary | ICD-10-CM

## 2023-05-09 DIAGNOSIS — R7989 Other specified abnormal findings of blood chemistry: Secondary | ICD-10-CM

## 2023-05-09 DIAGNOSIS — F410 Panic disorder [episodic paroxysmal anxiety] without agoraphobia: Secondary | ICD-10-CM

## 2023-05-09 DIAGNOSIS — K200 Eosinophilic esophagitis: Secondary | ICD-10-CM

## 2023-05-09 DIAGNOSIS — R140 Abdominal distension (gaseous): Secondary | ICD-10-CM

## 2023-05-09 DIAGNOSIS — R110 Nausea: Secondary | ICD-10-CM

## 2023-05-09 DIAGNOSIS — F419 Anxiety disorder, unspecified: Secondary | ICD-10-CM

## 2023-05-09 MED ORDER — ALPRAZOLAM 0.25 MG PO TABS: 0.2500 mg | ORAL_TABLET | Freq: Every day | ORAL | 0 refills | Status: AC | PRN

## 2023-05-09 MED ORDER — BUSPIRONE HCL 5 MG PO TABS: 5.0000 mg | ORAL_TABLET | Freq: Every day | ORAL | 0 refills | Status: DC | PRN

## 2023-05-09 NOTE — Patient Instructions (Signed)
Managing Stress, Adult Feeling a certain amount of stress is normal. Stress helps our body and mind get ready to deal with the demands of life. Stress hormones can motivate you to do well at work and meet your responsibilities. But severe or long-term (chronic) stress can affect your mental and physical health. Chronic stress puts you at higher risk for: Anxiety and depression. Other health problems such as digestive problems, muscle aches, heart disease, high blood pressure, and stroke. What are the causes? Common causes of stress include: Demands from work, such as deadlines, feeling overworked, or having long hours. Pressures at home, such as money issues, disagreements with a spouse, or parenting issues. Pressures from major life changes, such as divorce, moving, loss of a loved one, or chronic illness. You may be at higher risk for stress-related problems if you: Do not get enough sleep. Are in poor health. Do not have emotional support. Have a mental health disorder such as anxiety or depression. How to recognize stress Stress can make you: Have trouble sleeping. Feel sad, anxious, irritable, or overwhelmed. Lose your appetite. Overeat or want to eat unhealthy foods. Want to use drugs or alcohol. Stress can also cause physical symptoms, such as: Sore, tense muscles, especially in the shoulders and neck. Headaches. Trouble breathing. A faster heart rate. Stomach pain, nausea, or vomiting. Diarrhea or constipation. Trouble concentrating. Follow these instructions at home: Eating and drinking Eat a healthy diet. This includes: Eating foods that are high in fiber, such as beans, whole grains, and fresh fruits and vegetables. Limiting foods that are high in fat and processed sugars, such as fried or sweet foods. Do not skip meals or overeat. Drink enough fluid to keep your urine pale yellow. Alcohol use Do not drink alcohol if: Your health care provider tells you not to  drink. You are pregnant, may be pregnant, or are planning to become pregnant. Drinking alcohol is a way some people try to ease their stress. This can be dangerous, so if you drink alcohol: Limit how much you have to: 0-1 drink a day for women. 0-2 drinks a day for men. Know how much alcohol is in your drink. In the [place] one drink equals one 12 oz bottle of beer (355 mL), one 5 oz glass of wine (148 mL), or one 1 oz glass of hard liquor (44 mL). Activity  Include 30 minutes of exercise in your daily schedule. Exercise is a good stress reducer. Include time in your day for an activity that you find relaxing. Try taking a walk, going on a bike ride, reading a book, or listening to music. Schedule your time in a way that lowers stress, and keep a regular schedule. Focus on doing what is most important to get done. Lifestyle Identify the source of your stress and your reaction to it. See a therapist who can help you change unhelpful reactions. When there are stressful events: Talk about them with family, friends, or coworkers. Try to think realistically about stressful events and not ignore them or overreact. Try to find the positives in a stressful situation and not focus on the negatives. Cut back on responsibilities at work and home, if possible. Ask for help from friends or family members if you need it. Find ways to manage stress, such as: Mindfulness, meditation, or deep breathing. Yoga or tai chi. Progressive muscle relaxation. Spending time in nature. Doing art, playing music, or reading. Making time for fun activities. Spending time with family and friends. Get support  from family, friends, or spiritual resources. General instructions Get enough sleep. Try to go to sleep and get up at about the same time every day. Take over-the-counter and prescription medicines only as told by your health care provider. Do not use any products that contain nicotine or tobacco. These products  include cigarettes, chewing tobacco, and vaping devices, such as e-cigarettes. If you need help quitting, ask your health care provider. Do not use drugs or smoke to deal with stress. Keep all follow-up visits. This is important. Where to find support Talk with your health care provider about stress management or finding a support group. Find a therapist to work with you on your stress management techniques. Where to find more information [employer] on Mental Illness: www.nami.org American Psychological Association: [employer].ca Contact a health care provider if: Your stress symptoms get worse. You are unable to manage your stress at home. You are struggling to stop using drugs or alcohol. Get help right away if: You may be a danger to yourself or others. You have any thoughts of death or suicide. Get help right awayif you feel like you may hurt yourself or others, or have thoughts about taking your own life. Go to your nearest emergency room or: Call 911. Call the National Suicide Prevention Lifeline at (801)577-0107 or 988 in the [place] This is open 24 hours a day. If you're a Veteran: Call 988 and press 1. This is open 24 hours a day. Text the [employer] at 5185138396. Summary Feeling a certain amount of stress is normal, but severe or long-term (chronic) stress can affect your mental and physical health. Chronic stress can put you at higher risk for anxiety, depression, and other health problems such as digestive problems, muscle aches, heart disease, high blood pressure, and stroke. You may be at higher risk for stress-related problems if you do not get enough sleep, are in poor health, lack emotional support, or have a mental health disorder such as anxiety or depression. Identify the source of your stress and your reaction to it. Try talking about stressful events with family, friends, or coworkers, finding a coping method, or getting support from spiritual resources. If  you need more help, talk with your health care provider about finding a support group or a mental health therapist. This information is not intended to replace advice given to you by your health care provider. Make sure you discuss any questions you have with your health care provider. Document Revised: 11/16/2022 Document Reviewed: 10/26/2020 Elsevier Patient Education  2024 [employer].

## 2023-05-09 NOTE — Progress Notes (Signed)
Subjective:    Patient ID: Jack Hicks, male    DOB: May 16, 1990, 33 y.o.   MRN: [id number]  HPI  Discussed the use of AI scribe software for clinical note transcription with the patient, who gave verbal consent to proceed.   The patient, with a history of gastrointestinal issues, presents with a recurrence of stomach discomfort and anxiety. Approximately a week and a half ago, the patient experienced symptoms similar to previous hospitalizations for stomach issues, but noted a different sensation this time, more akin to his initial episode. The discomfort was managed with increased doses of Pepcid and omeprazole, and the patient had Vicodin on hand for potential severe pain, although it was not used.  Following a few days of this regimen, the patient's discomfort transitioned to a sensation of fullness and nausea, prompting concerns of partial blockages and constipation. The patient noted constipation, but still had bowel movements, reducing concerns of a blockage. The patient reduced antacid use to a minimum and started Miralax, which gradually alleviated the constipation over the next couple of days. However, the patient's stomach still feels "not quite right," with pain when hungry, suggesting acid buildup. The patient is hesitant to resume antacids due to previous constipation.  The patient also reports a recent episode of severe anxiety, describing it as the closest he has come to a panic attack. This episode occurred during a relatively carefree day, but the patient acknowledges an increased workload and additional stressors at home. The anxiety episode was severe enough to disrupt the patient's workday and persisted into the evening, despite taking hydroxyzine. The patient woke up the following morning still feeling on edge, with chest tightness and shortness of breath, which was particularly noticeable when reading to his children.  The patient has a history of trying Lexapro for  anxiety, but discontinued it after two days due to feeling numb and like a "zombie." The patient also tried testosterone injections for low energy levels related to low testosterone. The first injection, administered in the thigh at the doctor's office, resulted in feeling fantastic for a few days. However, the second injection, administered at home in the glute, did not yield the same effect. The patient is due for another injection soon.       Review of Systems   Past Medical History:  Diagnosis Date   Allergy    Heart murmur    as child   History of chicken pox    Low testosterone    Thyroid disease    borderline Hypothyroidism     Current Outpatient Medications  Medication Sig Dispense Refill   hydrOXYzine (ATARAX) 10 MG/5ML syrup Take 12.5 mLs (25 mg total) by mouth 3 (three) times daily as needed. 240 mL 0   ketoconazole (NIZORAL) 2 % shampoo SHAMPOO (2-3 TIMES) WEEKLY. APPLY, LATHER - LEAVE ON FOR 5 MIN BEFORE RINSING.     No current facility-administered medications for this visit.    No Known Allergies  Family History  Problem Relation Age of Onset   Hyperlipidemia Mother    Hypertension Mother    Hyperlipidemia Father    Hypertension Father    Healthy Sister    Hyperlipidemia Maternal Grandmother    Hypertension Maternal Grandmother    Prostate cancer Maternal Grandfather    Hyperlipidemia Maternal Grandfather    Stroke Maternal Grandfather    Hypertension Maternal Grandfather    Parkinson's disease Maternal Grandfather    Hyperlipidemia Paternal Grandmother    Hypertension Paternal Grandmother  Arthritis Paternal Grandfather    Hyperlipidemia Paternal Grandfather    Hypertension Paternal Grandfather     Social History   Socioeconomic History   Marital status: Married    Spouse name: Not on file   Number of children: Not on file   Years of education: Not on file   Highest education level: Bachelor's degree (e.g., BA, AB, BS)  Occupational History    Not on file  Tobacco Use   Smoking status: Never   Smokeless tobacco: Never  Vaping Use   Vaping status: Never Used  Substance and Sexual Activity   Alcohol use: Yes    Alcohol/week: 1.0 standard drink of alcohol    Types: 1 Standard drinks or equivalent per week   Drug use: No   Sexual activity: Yes  Other Topics Concern   Not on file  Social History Narrative   Not on file   Social Drivers of Health   Financial Resource Strain: Low Risk  (03/30/2023)   Received from [facility]   Overall Financial Resource Strain (CARDIA)    Difficulty of Paying Living Expenses: Not very hard  Food Insecurity: No Food Insecurity (03/30/2023)   Received from [facility]   Hunger Vital Sign    Worried About Running Out of Food in the Last Year: Never true    Ran Out of Food in the Last Year: Never true  Transportation Needs: No Transportation Needs (03/30/2023)   Received from [facility] - Transportation    In the past 12 months, has lack of transportation kept you from medical appointments or from getting medications?: No    Lack of Transportation (Non-Medical): No  Physical Activity: Sufficiently Active (03/01/2023)   Exercise Vital Sign    Days of Exercise per Week: 3 days    Minutes of Exercise per Session: 50 min  Stress: No Stress Concern Present (03/01/2023)   [employer] of Occupational Health - Occupational Stress Questionnaire    Feeling of Stress : Only a little  Social Connections: Moderately Isolated (03/01/2023)   Social Connection and Isolation Panel [NHANES]    Frequency of Communication with Friends and Family: More than three times a week    Frequency of Social Gatherings with Friends and Family: More than three times a week    Attends Religious Services: Never    Database administrator or Organizations: No    Attends Engineer, structural: Not on file    Marital Status: Married   Catering manager Violence: Not on file     Constitutional: Denies fever, malaise, fatigue, headache or abrupt weight changes.  Respiratory: Pt reports shortness of breath. Denies difficulty breathing, cough or sputum production.   Cardiovascular: Pt reports chest tightness. Denies chest pain, palpitations or swelling in the hands or feet.  Gastrointestinal: Pt reports nausea, bloating, abdominal pain and constipation. Denies bloating, diarrhea or blood in the stool.  GU: Patient reports difficulty with erections.  Denies urgency, frequency, pain with urination, burning sensation, blood in urine, odor or discharge. Musculoskeletal: Denies decrease in range of motion, difficulty with gait, muscle pain or joint pain and swelling.  Skin: Denies redness, rashes, lesions or ulcercations.  Neurological: Denies dizziness, difficulty with memory, difficulty with speech or problems with balance and coordination.  Psych: Patient has a history of anxiety, reports stress  Denies depression, SI/HI.  No other specific complaints in a complete review of systems (except as listed in HPI above).  Objective:   Physical Exam BP 138/82 (BP Location: Left Arm, Patient Position: Sitting, Cuff Size: Normal)   Pulse 95   Ht 5\' 7"  (1.702 m)   Wt 176 lb 12.8 oz (80.2 kg)   SpO2 99%   BMI 27.69 kg/m    Wt Readings from Last 3 Encounters:  04/03/23 174 lb (78.9 kg)  03/01/23 175 lb 12.8 oz (79.7 kg)  11/28/22 175 lb (79.4 kg)    General: Appears his stated age, overweight, in NAD. Skin: Warm, dry and intact.  Cardiovascular: Normal rate and rhythm. S1,S2 noted.  No murmur, rubs or gallops noted.  Pulmonary/Chest: Normal effort and positive vesicular breath sounds. No respiratory distress. No wheezes, rales or ronchi noted.  Abdomen: Soft and tender in the epigastric region.  Active bowel sounds.  No distention or masses noted. Neurological: Alert and oriented.  Psych: Mood and affect normal. Mildly  anxious appearing. Judgment and thought content normal.  BMET    Component Value Date/Time   NA 139 04/03/2023 1000   K 4.2 04/03/2023 1000   CL 103 04/03/2023 1000   CO2 28 04/03/2023 1000   GLUCOSE 80 04/03/2023 1000   BUN 18 04/03/2023 1000   CREATININE 0.81 04/03/2023 1000   CALCIUM 9.8 04/03/2023 1000   GFRNONAA >60 10/19/2022 1909   GFRAA >60 04/25/2019 0817    Lipid Panel     Component Value Date/Time   CHOL 197 04/03/2023 1000   TRIG 160 (H) 04/03/2023 1000   HDL 52 04/03/2023 1000   CHOLHDL 3.8 04/03/2023 1000   VLDL 19.4 07/03/2019 1204   LDLCALC 117 (H) 04/03/2023 1000    CBC    Component Value Date/Time   WBC 6.8 04/03/2023 1000   RBC 5.52 04/03/2023 1000   HGB 16.3 04/03/2023 1000   HCT 47.8 04/03/2023 1000   PLT 289 04/03/2023 1000   MCV 86.6 04/03/2023 1000   MCH 29.5 04/03/2023 1000   MCHC 34.1 04/03/2023 1000   RDW 12.7 04/03/2023 1000   LYMPHSABS 3.5 10/19/2022 1909   MONOABS 0.7 10/19/2022 1909   EOSABS 0.2 10/19/2022 1909   BASOSABS 0.1 10/19/2022 1909    Hgb A1C Lab Results  Component Value Date   HGBA1C 5.7 (H) 04/03/2023            Assessment & Plan:   Assessment and Plan    Gastrointestinal Discomfort History of esophageal eosinophilic disorder with recent exacerbation of symptoms. Patient self-managed with increased antacids leading to constipation. Symptoms improved with reduction of antacids and use of Miralax. Patient and provider suspect possible missed ulcer. -Continue current management with omeprazole and famotidine as needed. -Consider Carafate as an alternative for symptom management.  Anxiety Recent episode of significant anxiety, almost to the level of a panic attack. Patient has hydroxyzine but finds it too sedating for daytime use. -Start Buspar 5mg  daily as needed for anxiety. -Provide small supply of Xanax for acute anxiety episodes.  Testosterone Deficiency Patient trialed testosterone injections with  initial positive response but experienced drop off in effect. -Continue current management plan with testosterone injections.  Follow-up in August 2025 for annual physical.        RTC in 7 months for follow-up of chronic conditions

## 2023-05-10 ENCOUNTER — Encounter: Payer: Self-pay | Admitting: Internal Medicine

## 2023-05-31 ENCOUNTER — Ambulatory Visit: Payer: BC Managed Care – PPO | Admitting: Internal Medicine

## 2023-07-11 ENCOUNTER — Other Ambulatory Visit: Payer: Self-pay | Admitting: Internal Medicine

## 2023-07-12 ENCOUNTER — Other Ambulatory Visit: Payer: Self-pay | Admitting: Internal Medicine

## 2023-07-13 ENCOUNTER — Encounter: Payer: Self-pay | Admitting: Internal Medicine

## 2023-07-13 NOTE — Telephone Encounter (Signed)
 OV 05/09/23 Requested Prescriptions  Pending Prescriptions Disp Refills   hydrOXYzine (ATARAX) 10 MG/5ML syrup [Pharmacy Med Name: HYDROXYZINE 10 MG/5 ML SYRUP] 240 mL 0    Sig: TAKE 12.5 ML BY MOUTH 3 TIMES A DAY AS NEEDED     Ear, Nose, and Throat:  Antihistamines 2 Failed - 07/13/2023  9:32 AM      Failed - Valid encounter within last 12 months    Recent Outpatient Visits   None            Passed - Cr in normal range and within 360 days    Creat  Date Value Ref Range Status  04/03/2023 0.81 0.60 - 1.26 mg/dL Final

## 2023-10-16 ENCOUNTER — Other Ambulatory Visit: Payer: Self-pay | Admitting: Internal Medicine

## 2023-10-18 NOTE — Telephone Encounter (Signed)
 Requested Prescriptions  Pending Prescriptions Disp Refills   hydrOXYzine  (ATARAX ) 10 MG/5ML syrup [Pharmacy Med Name: HYDROXYZINE  10 MG/5 ML SYRUP] 240 mL 0    Sig: TAKE 12.5 ML BY MOUTH 3 TIMES A DAY AS NEEDED     Ear, Nose, and Throat:  Antihistamines 2 Failed - 10/18/2023  3:43 PM      Failed - Valid encounter within last 12 months    Recent Outpatient Visits   None            Passed - Cr in normal range and within 360 days    Creat  Date Value Ref Range Status  04/03/2023 0.81 0.60 - 1.26 mg/dL Final

## 2023-10-20 ENCOUNTER — Other Ambulatory Visit: Payer: Self-pay | Admitting: Internal Medicine

## 2023-10-23 NOTE — Telephone Encounter (Signed)
 Due August for appointment Requested Prescriptions  Pending Prescriptions Disp Refills   busPIRone  (BUSPAR ) 5 MG tablet [Pharmacy Med Name: BUSPIRONE  HCL 5 MG TAB] 90 tablet 0    Sig: TAKE 1 TABLET BY MOUTH ONCE DAILY AS NEEDED     Psychiatry: Anxiolytics/Hypnotics - Non-controlled Failed - 10/23/2023  1:56 PM      Failed - Valid encounter within last 12 months    Recent Outpatient Visits   None

## 2023-11-30 ENCOUNTER — Ambulatory Visit (INDEPENDENT_AMBULATORY_CARE_PROVIDER_SITE_OTHER): Payer: Self-pay | Admitting: Internal Medicine

## 2023-11-30 ENCOUNTER — Encounter: Payer: Self-pay | Admitting: Internal Medicine

## 2023-11-30 VITALS — BP 138/88 | Ht 67.0 in | Wt 179.2 lb

## 2023-11-30 DIAGNOSIS — E7800 Pure hypercholesterolemia, unspecified: Secondary | ICD-10-CM

## 2023-11-30 DIAGNOSIS — R140 Abdominal distension (gaseous): Secondary | ICD-10-CM

## 2023-11-30 DIAGNOSIS — R0789 Other chest pain: Secondary | ICD-10-CM

## 2023-11-30 DIAGNOSIS — R194 Change in bowel habit: Secondary | ICD-10-CM

## 2023-11-30 DIAGNOSIS — R079 Chest pain, unspecified: Secondary | ICD-10-CM | POA: Diagnosis not present

## 2023-11-30 DIAGNOSIS — R1013 Epigastric pain: Secondary | ICD-10-CM

## 2023-11-30 DIAGNOSIS — R0609 Other forms of dyspnea: Secondary | ICD-10-CM | POA: Diagnosis not present

## 2023-11-30 DIAGNOSIS — R739 Hyperglycemia, unspecified: Secondary | ICD-10-CM

## 2023-11-30 MED ORDER — BUSPIRONE HCL 5 MG PO TABS: 5.0000 mg | ORAL_TABLET | Freq: Every day | ORAL | 0 refills | Status: AC | PRN

## 2023-11-30 NOTE — Progress Notes (Signed)
 Subjective:    Patient ID: Penne DELENA Crumb, male    DOB: 1990-08-12, 33 y.o.   MRN: [id number]  HPI  Discussed the use of AI scribe software for clinical note transcription with the patient, who gave verbal consent to proceed.  AHMET SCHANK is a 33 year old male with eosinophilic esophagitis who presents with chest tightness and gastrointestinal issues.  He experiences ongoing chest tightness and occasional chest pain, described as a sensation of his heart about to explode, which he alleviates with breathing exercises. These symptoms occur intermittently, especially during exertion like climbing stairs, leading to shortness of breath.  Although, he reports that he can go to the gym and lift weights for an hour and not have any issues with shortness of breath.  No history of asthma. Pulse oximetry remains normal during episodes.  He has a history of eosinophilic esophagitis and is uncertain if his symptoms are related to this condition or a cardiac issue. He experiences a burning sensation in the center of his chest, distinct from typical heartburn or indigestion.  He has had a normal echocardiogram and exercise tolerance test in 2017.  He is dealing with stress and anxiety, which he believes may contribute to his symptoms. He has been taking hydroxyzine  for years, which he finds helpful, but has reduced its use due to concerns about its effects on his digestive tract. He also takes Buspar  for anxiety, and would like this refilled today.  He reports gastrointestinal issues, particularly with dairy consumption, leading to stomach pains and bloating. His bowel movements are described as 'sticky' and require frequent wiping. He has tried MAG07, a magnesium  supplement, which resulted in a significant bowel movement.  Recently, he experienced sudden pain radiating from his left buttock down his leg, accompanied by nausea and cold sweats, resolving after about half an hour. He has not had a  colonoscopy and is concerned about potential colon cancer, although he has not experienced weight loss.  He has a family history of a cousin who died from congestive heart failure at 60 due to a hereditary condition, but his mother tested negative for the gene associated with this condition.       Review of Systems     Past Medical History:  Diagnosis Date   Allergy    Heart murmur    as child   History of chicken pox    Low testosterone     Thyroid  disease    borderline Hypothyroidism     Current Outpatient Medications  Medication Sig Dispense Refill   ALPRAZolam  (XANAX ) 0.25 MG tablet Take 1 tablet (0.25 mg total) by mouth daily as needed for anxiety. 5 tablet 0   busPIRone  (BUSPAR ) 5 MG tablet TAKE 1 TABLET BY MOUTH ONCE DAILY AS NEEDED 90 tablet 0   hydrOXYzine  (ATARAX ) 10 MG/5ML syrup TAKE 12.5 ML BY MOUTH 3 TIMES A DAY AS NEEDED 240 mL 0   ketoconazole (NIZORAL) 2 % shampoo SHAMPOO (2-3 TIMES) WEEKLY. APPLY, LATHER - LEAVE ON FOR 5 MIN BEFORE RINSING.     No current facility-administered medications for this visit.    No Known Allergies  Family History  Problem Relation Age of Onset   Hyperlipidemia Mother    Hypertension Mother    Hyperlipidemia Father    Hypertension Father    Healthy Sister    Hyperlipidemia Maternal Grandmother    Hypertension Maternal Grandmother    Prostate cancer Maternal Grandfather    Hyperlipidemia Maternal Grandfather    Stroke  Maternal Grandfather    Hypertension Maternal Grandfather    Parkinson's disease Maternal Grandfather    Hyperlipidemia Paternal Grandmother    Hypertension Paternal Grandmother    Arthritis Paternal Grandfather    Hyperlipidemia Paternal Grandfather    Hypertension Paternal Grandfather     Social History   Socioeconomic History   Marital status: Married    Spouse name: Not on file   Number of children: Not on file   Years of education: Not on file   Highest education level: Bachelor's degree (e.g.,  BA, AB, BS)  Occupational History   Not on file  Tobacco Use   Smoking status: Never   Smokeless tobacco: Never  Vaping Use   Vaping status: Never Used  Substance and Sexual Activity   Alcohol use: Yes    Alcohol/week: 1.0 standard drink of alcohol    Types: 1 Standard drinks or equivalent per week   Drug use: No   Sexual activity: Yes  Other Topics Concern   Not on file  Social History Narrative   Not on file   Social Drivers of Health   Financial Resource Strain: Low Risk  (03/30/2023)   Received from [facility]   Overall Financial Resource Strain (CARDIA)    Difficulty of Paying Living Expenses: Not very hard  Food Insecurity: No Food Insecurity (03/30/2023)   Received from [facility]   Hunger Vital Sign    Within the past 12 months, you worried that your food would run out before you got the money to buy more.: Never true    Within the past 12 months, the food you bought just didn't last and you didn't have money to get more.: Never true  Transportation Needs: No Transportation Needs (03/30/2023)   Received from [facility] Transportation    In the past 12 months, has lack of transportation kept you from medical appointments or from getting medications?: No    Lack of Transportation (Non-Medical): No  Physical Activity: Sufficiently Active (03/01/2023)   Exercise Vital Sign    Days of Exercise per Week: 3 days    Minutes of Exercise per Session: 50 min  Stress: No Stress Concern Present (03/01/2023)   [employer] of Occupational Health - Occupational Stress Questionnaire    Feeling of Stress : Only a little  Social Connections: Moderately Isolated (03/01/2023)   Social Connection and Isolation Panel    Frequency of Communication with Friends and Family: More than three times a week    Frequency of Social Gatherings with Friends and Family: More than three times a week    Attends Religious  Services: Never    Database administrator or Organizations: No    Attends Engineer, structural: Not on file    Marital Status: Married  Catering manager Violence: Not on file     Constitutional: Pt reports fatigue. Denies fever, malaise, headache or abrupt weight changes.  HEENT: Denies eye pain, eye redness, ear pain, ringing in the ears, wax buildup, runny nose, nasal congestion, bloody nose, or sore throat. Respiratory: Pt reports intermittent shortness of breath. Denies difficulty breathing, shortness of breath, cough or sputum production.   Cardiovascular: Pt reports intermittent chest tightness, chest pain. Denies palpitations or swelling in the hands or feet.  Gastrointestinal: Pt reports intermittent epigastric pain, bloating, sticky bowel movements. Denies constipation, diarrhea or blood in the stool.  GU: Denies urgency, frequency, pain with urination, burning sensation, blood in  urine, odor or discharge. Musculoskeletal: Denies decrease in range of motion, difficulty with gait, muscle pain or joint pain and swelling.  Skin: Pt reports thinning hair. Denies redness, rashes, lesions or ulcercations.  Neurological: Denies dizziness, difficulty with memory, difficulty with speech or problems with balance and coordination.  Psych: Patient has a history of anxiety.  Denies depression, SI/HI.  No other specific complaints in a complete review of systems (except as listed in HPI above).  Objective:   Physical Exam  BP 138/88 (BP Location: Left Arm, Patient Position: Sitting, Cuff Size: Normal)   Ht 5' 7 (1.702 m)   Wt 179 lb 3.2 oz (81.3 kg)   BMI 28.07 kg/m    Wt Readings from Last 3 Encounters:  05/09/23 176 lb 12.8 oz (80.2 kg)  04/03/23 174 lb (78.9 kg)  03/01/23 175 lb 12.8 oz (79.7 kg)    General: Appears his stated age, overweight, in NAD. Skin: Warm, dry and intact.  Cardiovascular: Normal rate and rhythm. S1,S2 noted.  No murmur, rubs or gallops noted.   Pulmonary/Chest: Normal effort and positive vesicular breath sounds. No respiratory distress. No wheezes, rales or ronchi noted.  Musculoskeletal:  No difficulty with gait.  Neurological: Alert and oriented.  Coordination normal.  Psychiatric: Mood and affect normal.  Mildly anxious appearing. Judgment and thought content normal.    BMET    Component Value Date/Time   NA 139 04/03/2023 1000   K 4.2 04/03/2023 1000   CL 103 04/03/2023 1000   CO2 28 04/03/2023 1000   GLUCOSE 80 04/03/2023 1000   BUN 18 04/03/2023 1000   CREATININE 0.81 04/03/2023 1000   CALCIUM 9.8 04/03/2023 1000   GFRNONAA >60 10/19/2022 1909   GFRAA >60 04/25/2019 0817    Lipid Panel     Component Value Date/Time   CHOL 197 04/03/2023 1000   TRIG 160 (H) 04/03/2023 1000   HDL 52 04/03/2023 1000   CHOLHDL 3.8 04/03/2023 1000   VLDL 19.4 07/03/2019 1204   LDLCALC 117 (H) 04/03/2023 1000    CBC    Component Value Date/Time   WBC 6.8 04/03/2023 1000   RBC 5.52 04/03/2023 1000   HGB 16.3 04/03/2023 1000   HCT 47.8 04/03/2023 1000   PLT 289 04/03/2023 1000   MCV 86.6 04/03/2023 1000   MCH 29.5 04/03/2023 1000   MCHC 34.1 04/03/2023 1000   RDW 12.7 04/03/2023 1000   LYMPHSABS 3.5 10/19/2022 1909   MONOABS 0.7 10/19/2022 1909   EOSABS 0.2 10/19/2022 1909   BASOSABS 0.1 10/19/2022 1909    Hgb A1C Lab Results  Component Value Date   HGBA1C 5.7 (H) 04/03/2023           Assessment & Plan:   Assessment and Plan    Chest tightness and intermittent chest pain with exertional shortness of breath Intermittent chest tightness and pain with exertion. Differential includes cardiac issues, gi issues, anxiety, and stress. Family history of hereditary heart condition. Previous cardiac workup included exercise tolerance test and echocardiogram. Considered coronary artery disease with CT coronary calcium score. - Order CT coronary calcium score. - Will check CBC, c-Met, lipid and A1c today.  Anxiety  and stress Ongoing stress and anxiety potentially contributing to symptoms. Previous use of hydroxyzine  and buspirone  for management. Hydroxyzine  effective when taken. - Refill buspirone  5 mg daily as needed. - Continue hydroxyzine  25 mg TID prn as needed.  Lactose intolerance with epigastric pain, bloating, changes in bowel habits Gastrointestinal symptoms associated with dairy intake.  Consideration of elimination diet and lactose intolerance testing. Symptoms may be related to stress and anxiety. - Initiate elimination diet. - Consider lactose tolerance testing. - Consider referral to GI for food allergy testing.      Schedule an appointment for your annual exam Angeline Laura, NP

## 2023-11-30 NOTE — Patient Instructions (Signed)
Coronary Calcium Scan A coronary calcium scan is an imaging test used to look for deposits of plaque in the inner lining of the blood vessels of the heart (coronary arteries). Plaque is made up of calcium, protein, and fatty substances. These deposits of plaque can partly clog and narrow the coronary arteries without producing any symptoms or warning signs. This puts a person at risk for a heart attack. A coronary calcium scan is performed using a computed tomography (CT) scanner machine without using a dye (contrast). This test is recommended for people who are at moderate risk for heart disease. The test can find plaque deposits before symptoms develop. Tell a health care provider about: Any allergies you have. All medicines you are taking, including vitamins, herbs, eye drops, creams, and over-the-counter medicines. Any problems you or family members have had with anesthetic medicines. Any bleeding problems you have. Any surgeries you have had. Any medical conditions you have. Whether you are pregnant or may be pregnant. What are the risks? Generally, this is a safe procedure. However, problems may occur, including: Harm to a pregnant woman and her unborn baby. This test involves the use of radiation. Radiation exposure can be dangerous to a pregnant woman and her unborn baby. If you are pregnant or think you may be pregnant, you should not have this procedure done. A slight increase in the risk of cancer. This is because of the radiation involved in the test. The amount of radiation from one test is similar to the amount of radiation you are naturally exposed to over one year. What happens before the procedure? Ask your health care provider for any specific instructions on how to prepare for this procedure. You may be asked to avoid products that contain caffeine, tobacco, or nicotine for 4 hours before the procedure. What happens during the procedure?  You will undress and remove any jewelry  from your neck or chest. You may need to remove hearing aides and dentures. Women may need to remove their bras. You will put on a hospital gown. Sticky electrodes will be placed on your chest. The electrodes will be connected to an electrocardiogram (ECG) machine to record a tracing of the electrical activity of your heart. You will lie down on your back on a curved bed that is attached to the CT scanner. You may be given medicine to slow down your heart rate so that clear pictures can be created. You will be moved into the CT scanner, and the CT scanner will take pictures of your heart. During this time, you will be asked to lie still and hold your breath for 10-20 seconds at a time while each picture of your heart is being taken. The procedure may vary among health care providers and hospitals. What can I expect after the procedure? You can return to your normal activities. It is up to you to get the results of your procedure. Ask your health care provider, or the department that is doing the procedure, when your results will be ready. Summary A coronary calcium scan is an imaging test used to look for deposits of plaque in the inner lining of the blood vessels of the heart. Plaque is made up of calcium, protein, and fatty substances. A coronary calcium scan is performed using a CT scanner machine without contrast. Generally, this is a safe procedure. Tell your health care provider if you are pregnant or may be pregnant. Ask your health care provider for any specific instructions on how to  prepare for this procedure. You can return to your normal activities after the scan is done. This information is not intended to replace advice given to you by your health care provider. Make sure you discuss any questions you have with your health care provider. Document Revised: 03/13/2021 Document Reviewed: 03/13/2021 Elsevier Patient Education  2024 [employer].

## 2023-12-06 ENCOUNTER — Ambulatory Visit
Admission: RE | Admit: 2023-12-06 | Discharge: 2023-12-06 | Disposition: A | Discharge status: Home / Self Care | Payer: Self-pay | Source: Ambulatory Visit | Attending: Internal Medicine | Admitting: Internal Medicine

## 2023-12-06 DIAGNOSIS — R0789 Other chest pain: Secondary | ICD-10-CM | POA: Insufficient documentation

## 2023-12-06 DIAGNOSIS — R079 Chest pain, unspecified: Secondary | ICD-10-CM | POA: Insufficient documentation

## 2023-12-06 DIAGNOSIS — R0609 Other forms of dyspnea: Secondary | ICD-10-CM | POA: Insufficient documentation

## 2023-12-06 LAB — LIPID PANEL
Cholesterol: 203 — AB (ref 0–200)
HDL: 39 (ref 35–70)
LDL Cholesterol: 145
LDl/HDL Ratio: 5.2
Triglycerides: 104 (ref 40–160)

## 2023-12-06 LAB — CBC AND DIFFERENTIAL
HCT: 53 (ref 41–53)
Hemoglobin: 17.1 (ref 13.5–17.5)
Neutrophils Absolute: 3.6
Platelets: 325 K/uL (ref 150–400)
WBC: 6.9

## 2023-12-06 LAB — COMPREHENSIVE METABOLIC PANEL WITH GFR
Albumin: 4.6 (ref 3.5–5.0)
Calcium: 9.9 (ref 8.7–10.7)
Globulin: 3
eGFR: 101

## 2023-12-06 LAB — HEPATIC FUNCTION PANEL
ALT: 24 U/L (ref 10–40)
AST: 22 (ref 14–40)
Alkaline Phosphatase: 119 (ref 25–125)
Bilirubin, Total: 0.4

## 2023-12-06 LAB — HEMOGLOBIN A1C: Hemoglobin A1C: 5.6

## 2023-12-06 LAB — BASIC METABOLIC PANEL WITH GFR
BUN: 17 (ref 4–21)
Chloride: 99 (ref 99–108)
Creatinine: 1 (ref 0.6–1.3)
Glucose: 86
Potassium: 4.5 meq/L (ref 3.5–5.1)
Sodium: 139 (ref 137–147)

## 2023-12-06 LAB — CBC: RBC: 5.94 — AB (ref 3.87–5.11)

## 2023-12-07 ENCOUNTER — Encounter: Payer: Self-pay | Admitting: Internal Medicine

## 2023-12-07 ENCOUNTER — Ambulatory Visit: Payer: Self-pay | Admitting: Internal Medicine

## 2024-02-04 ENCOUNTER — Encounter: Payer: Self-pay | Admitting: Internal Medicine

## 2024-02-04 DIAGNOSIS — K200 Eosinophilic esophagitis: Secondary | ICD-10-CM

## 2024-02-29 ENCOUNTER — Encounter: Payer: Self-pay | Admitting: Physician Assistant

## 2024-03-16 NOTE — Progress Notes (Unsigned)
 Ellouise Console, PA-C [address] [place], [place]  [address] Phone: [phone]   Gastroenterology Consultation  Referring Provider:     Antonette Angeline ORN, NP Primary Care Physician:  Antonette Angeline ORN, NP Primary Gastroenterologist:  Ellouise Console, PA-C / Glendia Holt, MD  Reason for Consultation:     Eosinophilic esophagitis        HPI:   Discussed the use of AI scribe software for clinical note transcription with the patient, who gave verbal consent to proceed. History of Present Illness Jack Hicks is a 33 year old male with eosinophilic esophagitis who presents with recurrent abdominal pain and gastrointestinal symptoms.  He has experienced severe episodic abdominal pain for the past three years, initially presenting as pain radiating from the abdomen to the back, similar to kidney stones. During multiple ER visits, a GI cocktail provided relief. An upper endoscopy led to a diagnosis of eosinophilic esophagitis, and he was treated with omeprazole  40 mg twice daily with benefit.  GI symptoms resolved.  He weaned off PPI.  This year, he has had recurrent abdominal discomfort triggered by dairy intake, stress, caffeine, and fatty foods. The discomfort, initially intermittent, has increased in frequency over the past nine months. He reports 'tarry stools' that are brown, not black.  Abdominal pain can be located diffusely throughout the entire abdomen upper or lower and periumbilical.  He has associated abdominal bloating, gas, and constipation.  He wonders if he has IBS.  His symptoms improved over Thanksgiving when stress was reduced, despite consuming dairy and caffeine. He also reports incomplete bowel emptying, occasional mucus in the stool, mild abdominal discomfort, bloating, and occasional constipation. Larger bowel movements cause more spasms. Psyllium fiber has helped regulate his bowel movements, and he takes Gas-X which helps.  He admits to constipation and has  bowel movement every 2 to 3 days.  He has not experienced weight loss, fever, vomiting, nocturnal pain, or blood in the stool. He intentionally lost 15 pounds recently. His mother has gallbladder disease with stones, requiring removal.  He has tried lactase tablets without relief and finds that avoiding dairy helps, though he questions if his symptoms are due to a true allergy or intolerance. He experiences more discomfort when lying down and occasionally has heartburn and chest pain, particularly after consuming certain foods.  GI HISTORY: Previous patient of Dr. Unk at [facility] gastroenterology.  Last evaluated by Dr. Vanga 12/2021 for dysphagia.  Patient has had difficulty swallowing solids since age 58.   Has history of seasonal allergies.  12/27/2021 EGD by Dr. Unk: Esophageal mucosal changes and biopsies consistent with eosinophilic esophagitis.  Normal stomach and duodenum.  Biopsies negative for H. pylori and celiac.  Esophageal biopsies showed up to 55 eosinophils per high-powered field.  No dysplasia.  He was started on omeprazole  40 mg twice daily in 2023 which helped his symptoms.  Dysphagia resolved on high-dose PPI.  He weaned off omeprazole .  No current PPI.  He denies any dysphagia.  Still has occasional episodes of heartburn.  06/2010 barium swallow: 1.  Normal esophageal motility.  2.  Small sliding-type hiatal hernia.  3.  Lower esophageal mucosal ring.   Past Medical History:  Diagnosis Date   Allergy    Heart murmur    as child   History of chicken pox    Low testosterone     Thyroid  disease    borderline Hypothyroidism     Past Surgical History:  Procedure Laterality Date  ESOPHAGOGASTRODUODENOSCOPY (EGD) WITH PROPOFOL  N/A 12/27/2021   Procedure: ESOPHAGOGASTRODUODENOSCOPY (EGD) WITH PROPOFOL ;  Surgeon: Unk Corinn Skiff, MD;  Location: [facility];  Service: Endoscopy;  Laterality: N/A;   TISSUE GRAFT Left 2017    Prior to Admission medications    Medication Sig Start Date End Date Taking? Authorizing Provider  ALPRAZolam  (XANAX ) 0.25 MG tablet Take 1 tablet (0.25 mg total) by mouth daily as needed for anxiety. Patient not taking: Reported on 11/30/2023 05/09/23   Antonette Angeline ORN, NP  busPIRone  (BUSPAR ) 5 MG tablet Take 1 tablet (5 mg total) by mouth daily as needed. 11/30/23   Antonette Angeline ORN, NP  hydrOXYzine  (ATARAX ) 10 MG/5ML syrup TAKE 12.5 ML BY MOUTH 3 TIMES A DAY AS NEEDED Patient taking differently: as needed. 10/18/23   Antonette Angeline ORN, NP    Family History  Problem Relation Age of Onset   Hyperlipidemia Mother    Hypertension Mother    Cholelithiasis Mother    Hyperlipidemia Father    Hypertension Father    Healthy Sister    Hyperlipidemia Maternal Grandmother    Hypertension Maternal Grandmother    Prostate cancer Maternal Grandfather    Hyperlipidemia Maternal Grandfather    Stroke Maternal Grandfather    Hypertension Maternal Grandfather    Parkinson's disease Maternal Grandfather    Hyperlipidemia Paternal Grandmother    Hypertension Paternal Grandmother    Arthritis Paternal Grandfather    Hyperlipidemia Paternal Grandfather    Hypertension Paternal Grandfather      Social History   Tobacco Use   Smoking status: Never   Smokeless tobacco: Never  Vaping Use   Vaping status: Never Used  Substance Use Topics   Alcohol use: Yes    Alcohol/week: 1.0 standard drink of alcohol    Types: 1 Standard drinks or equivalent per week    Comment: Occasionally   Drug use: No    Allergies as of 03/17/2024   (No Known Allergies)    Review of Systems:    All systems reviewed and negative except where noted in HPI.   Physical Exam:  BP 116/74 (BP Location: Left Arm, Patient Position: Sitting, Cuff Size: Normal)   Pulse 80   Ht 5' 7 (1.702 m)   Wt 164 lb 6 oz (74.6 kg)   BMI 25.74 kg/m  No LMP for male patient.  General:   Alert,  Well-developed, well-nourished, pleasant and cooperative in NAD Lungs:   Respirations even and unlabored.  Clear throughout to auscultation.   No wheezes, crackles, or rhonchi. No acute distress. Heart:  Regular rate and rhythm; no murmurs, clicks, rubs, or gallops. Abdomen:  Normal bowel sounds.  No bruits.  Soft, and non-distended without masses, hepatosplenomegaly or hernias noted.  No Tenderness.  No guarding or rebound tenderness.    Neurologic:  Alert and oriented x3;  grossly normal neurologically. Psych:  Alert and cooperative. Normal mood and affect.   Imaging Studies: No results found.  Labs: CBC    Component Value Date/Time   WBC 6.9 12/06/2023 0000   WBC 6.8 04/03/2023 1000   RBC 5.94 (A) 12/06/2023 0000   HGB 17.1 12/06/2023 0000   HCT 53 12/06/2023 0000   PLT 325 12/06/2023 0000   MCV 86.6 04/03/2023 1000    CMP     Component Value Date/Time   NA 139 12/06/2023 0000   K 4.5 12/06/2023 0000   CL 99 12/06/2023 0000   CO2 28 04/03/2023 1000   GLUCOSE 80 04/03/2023 1000  BUN 17 12/06/2023 0000   CREATININE 1.0 12/06/2023 0000   CREATININE 0.81 04/03/2023 1000   CALCIUM 9.9 12/06/2023 0000   PROT 7.4 04/03/2023 1000   ALBUMIN 4.6 12/06/2023 0000   AST 22 12/06/2023 0000   ALT 24 12/06/2023 0000   ALKPHOS 119 12/06/2023 0000   BILITOT 0.4 04/03/2023 1000   GFRNONAA >60 10/19/2022 1909   GFRAA >60 04/25/2019 0817    Assessment and Plan:   DARSHAN SOLANKI is a 33 y.o. y/o male has been referred for: Assessment & Plan 1.  Chronic, intermittent abdominal pain and bloating with constipation Chronic abdominal pain and bloating with constipation, exacerbated by stress, dairy, caffeine, and high-fat meals. Symptoms include incomplete evacuation, bloating, and occasional mucus in stool. Symptoms improve with fiber intake and worsen with stress. Differential includes irritable bowel syndrome, food intolerance, alpha gal, food allergy, lactose intolerance, and inflammatory bowel disease. - Ordered fecal calprotectin to evaluate for  inflammatory bowel disease. - Prescribed dicyclomine  10 mg 3 times daily as needed for abdominal pain. - Recommended daily fiber supplement, such as psyllium or Benefiber. - Recommended Miralax daily for constipation. - Provided information on low FODMAP diet. - Labs: Food allergy panel, celiac screen, alpha gal. - Avoid milk and dairy.  Try Lactaid milk or Lactaid supplement with dairy.  2.  Heartburn and gastroesophageal reflux disease without esophagitis Intermittent heartburn and gastroesophageal reflux symptoms, exacerbated by certain foods.  - Start OTC Pepcid  20 mg twice daily for heartburn. - If Pepcid  does not help, then start omeprazole  20 mg once or twice daily. - Recommend Lifestyle Modifications to prevent Acid Reflux.  Rec. Avoid coffee, sodas, peppermint, garlic, onions, alcohol, citrus fruits, chocolate, tomatoes, fatty and spicey foods.  Avoid eating 2-3 hours before bedtime.    3.  Eosinophilic esophagitis: Currently asymptomatic.  He is not having any dysphagia. - Patient education discussed. - Treat underlying acid reflux. - Ordering labs to check food allergy panel. - I advised him to let me know if he has any recurrent dysphagia symptoms.  Follow up with TG in 2 months.  Also follow-up based on lab results.  Ellouise Console, PA-C

## 2024-03-17 ENCOUNTER — Encounter: Payer: Self-pay | Admitting: Physician Assistant

## 2024-03-17 ENCOUNTER — Ambulatory Visit (INDEPENDENT_AMBULATORY_CARE_PROVIDER_SITE_OTHER): Payer: Self-pay | Admitting: Physician Assistant

## 2024-03-17 VITALS — BP 116/74 | HR 80 | Ht 67.0 in | Wt 164.4 lb

## 2024-03-17 DIAGNOSIS — K219 Gastro-esophageal reflux disease without esophagitis: Secondary | ICD-10-CM

## 2024-03-17 DIAGNOSIS — R140 Abdominal distension (gaseous): Secondary | ICD-10-CM

## 2024-03-17 DIAGNOSIS — R143 Flatulence: Secondary | ICD-10-CM

## 2024-03-17 DIAGNOSIS — K200 Eosinophilic esophagitis: Secondary | ICD-10-CM

## 2024-03-17 DIAGNOSIS — K5900 Constipation, unspecified: Secondary | ICD-10-CM

## 2024-03-17 DIAGNOSIS — R1084 Generalized abdominal pain: Secondary | ICD-10-CM

## 2024-03-17 DIAGNOSIS — R195 Other fecal abnormalities: Secondary | ICD-10-CM

## 2024-03-17 MED ORDER — FAMOTIDINE 20 MG PO TABS: 20.0000 mg | ORAL_TABLET | Freq: Two times a day (BID) | ORAL | Status: AC

## 2024-03-17 MED ORDER — DICYCLOMINE HCL 10 MG PO CAPS: 10.0000 mg | ORAL_CAPSULE | Freq: Three times a day (TID) | ORAL | 2 refills | Status: AC

## 2024-03-17 NOTE — Patient Instructions (Addendum)
   Start OTC Benefiber Powder. Mix 1 - 2 Tablespoons in 6 - 8 ounces of a Drink Once Daily. Drink 64 ounces of water  / fluids Daily.   For constipation: Start OTC Miralax Powder Mix 1 capful in 6 to 8 ounces of a drink once daily  Recommend high-fiber diet, 30 g of fiber daily Eat fruits, vegetables, and whole grains Drink 64 ounces of water  / fluids daily.    Please purchase the following medications over the counter and take as directed: Famotidine  (Pepcid ) 20 mg twice daily   We have sent the following medications to your pharmacy for you to pick up at your convenience: Dicyclomine  10 mg three times daily   Please follow up sooner if symptoms increase or worsen  Due to recent changes in healthcare laws, you may see the results of your imaging and laboratory studies on MyChart before your provider has had a chance to review them.  We understand that in some cases there may be results that are confusing or concerning to you. Not all laboratory results come back in the same time frame and the provider may be waiting for multiple results in order to interpret others.  Please give us  48 hours in order for your provider to thoroughly review all the results before contacting the office for clarification of your results.   Thank you for trusting me with your gastrointestinal care!   Ellouise Console, PA-C _______________________________________________________  If your blood pressure at your visit was 140/90 or greater, please contact your primary care physician to follow up on this.  _______________________________________________________  If you are age 45 or older, your body mass index should be between 23-30. Your Body mass index is 25.74 kg/m. If this is out of the aforementioned range listed, please consider follow up with your Primary Care Provider.  If you are age 45 or younger, your body mass index should be between 19-25. Your Body mass index is 25.74 kg/m. If this is out of the  aformentioned range listed, please consider follow up with your Primary Care Provider.   ________________________________________________________  The [facility] providers would like to encourage you to use MYCHART to communicate with providers for non-urgent requests or questions.  Due to long hold times on the telephone, sending your provider a message by [facility] may be a faster and more efficient way to get a response.  Please allow 48 business hours for a response.  Please remember that this is for non-urgent requests.  _______________________________________________________

## 2024-04-01 ENCOUNTER — Telehealth: Payer: Self-pay | Admitting: Physician Assistant

## 2024-04-01 NOTE — Progress Notes (Signed)
 Agree with the assessment and plan as outlined by Ellouise Console, PA-C, but would recommend follow up EGD with esophageal biopsies to confirm histologic remission of EoE.  Although it is good he is not having dysphagia, if he is having chronic inflammation, he will remain at risk for progressive fibrosis/scarring of his esophagus and histologic remission is the goal of treatment.  This can be booked at his follow up visit.

## 2024-04-01 NOTE — Telephone Encounter (Signed)
 Schedule follow-up EGD and office visit.

## 2024-04-01 NOTE — Telephone Encounter (Signed)
 Left message for patient to call back

## 2024-04-02 NOTE — Telephone Encounter (Signed)
 Spoke to patient to advise of recommendation for follow up appointment in the office with Dr Stacia as well as Dr London recommendation to have repeat endoscopy to assess the esophagus following EOE diagnosis. This would be to rule out any ongoing inflammation that could lead to esophagus scarring and problems in the future. Patient shares that he is on a special health savings program as he is self pay and he does not really want to have endoscopy at this time. He feels it is unnecessary and would also be expensive so would first prefer to have labs completed and will come for office visit. He was originally going to get labs completed elsewhere due to cost but has since been advised that his healthshare plan would not cover the cost at another facility either since the ordered tests are specialized. Therefore, he will plan to have these completed at our lab. Orders appear to have already been entered. Patient has scheduled an appointment for follow up with Dr Stacia on 05/12/24.  Patient wonders if he can be given an out of pocket estimate of what he may be charged for endoscopy should he decide to move forward with this in the future. Precert team, can you help with this?

## 2024-04-04 ENCOUNTER — Other Ambulatory Visit: Payer: Self-pay

## 2024-04-04 DIAGNOSIS — R195 Other fecal abnormalities: Secondary | ICD-10-CM

## 2024-04-04 DIAGNOSIS — R140 Abdominal distension (gaseous): Secondary | ICD-10-CM

## 2024-04-04 DIAGNOSIS — R1084 Generalized abdominal pain: Secondary | ICD-10-CM

## 2024-04-07 ENCOUNTER — Other Ambulatory Visit (INDEPENDENT_AMBULATORY_CARE_PROVIDER_SITE_OTHER): Payer: Self-pay

## 2024-04-07 ENCOUNTER — Telehealth: Payer: Self-pay | Admitting: Physician Assistant

## 2024-04-07 DIAGNOSIS — R143 Flatulence: Secondary | ICD-10-CM

## 2024-04-07 DIAGNOSIS — R195 Other fecal abnormalities: Secondary | ICD-10-CM

## 2024-04-07 DIAGNOSIS — R1084 Generalized abdominal pain: Secondary | ICD-10-CM

## 2024-04-07 DIAGNOSIS — R140 Abdominal distension (gaseous): Secondary | ICD-10-CM

## 2024-04-07 NOTE — Telephone Encounter (Signed)
Patient here for labs today

## 2024-04-09 LAB — CELIAC DISEASE AB SCREEN W/RFX
Deamidated Gliadin Abs, IgA: 7 U (ref 0–19)
Immunoglobulin A, (IgA) QN, Serum: 260 mg/dL (ref 90–386)
t-Transglutaminase (tTG) IgA: <2 U/mL (ref 0–3)

## 2024-04-10 LAB — CALPROTECTIN, FECAL: Calprotectin, Fecal: 49 ug/g (ref 0–120)

## 2024-04-12 LAB — PEANUT COMPONENT PANEL REFLEX
Ara h 1 (f422): <0.1 kU/L
Ara h 2 (f423): <0.1 kU/L
Ara h 3 (f424): <0.1 kU/L
Ara h 8 (f352): <0.1 kU/L
Ara h 9 (f427: <0.1 kU/L
F447-IgE Ara h 6: <0.1 kU/L

## 2024-04-12 LAB — FOOD ALLERGY PROFILE
"Macadamia Nut ": <0.1 kU/L
"Sesame Seed f10 ": <0.1 kU/L
Allergen, Salmon, f41: <0.1 kU/L
Almonds: <0.1 kU/L
Brazil Nut: <0.1 kU/L
CLASS: 0
CLASS: 0
CLASS: 0
CLASS: 0
CLASS: 0
CLASS: 0
CLASS: 0
CLASS: 0
CLASS: 0
CLASS: 0
Cashew IgE: <0.1 kU/L
Class: 0
Class: 0
Class: 0
Class: 0
Egg White IgE: <0.1 kU/L
Fish Cod: <0.1 kU/L
Hazelnut: <0.1 kU/L
Milk IgE: <0.1 kU/L
Peanut IgE: 0.17 kU/L — ABNORMAL HIGH
Scallop IgE: 0.17 kU/L — ABNORMAL HIGH
Shrimp IgE: <0.1 kU/L
Soybean IgE: <0.1 kU/L
Tuna IgE: <0.1 kU/L
Walnut: <0.1 kU/L
Wheat IgE: 0.19 kU/L — ABNORMAL HIGH

## 2024-04-12 LAB — ALPHA-GAL PANEL
Allergen, Mutton, f88: <0.1 kU/L
Allergen, Pork, f26: <0.1 kU/L
Beef: <0.1 kU/L
CLASS: 0
CLASS: 0
Class: 0
GALACTOSE-ALPHA-1,3-GALACTOSE IGE*: <0.1 kU/L

## 2024-04-12 LAB — INTERPRETATION:

## 2024-04-13 ENCOUNTER — Ambulatory Visit: Payer: Self-pay | Admitting: Physician Assistant

## 2024-04-18 ENCOUNTER — Other Ambulatory Visit: Payer: Self-pay | Admitting: Internal Medicine

## 2024-04-21 NOTE — Telephone Encounter (Signed)
 Requested medication (s) are due for refill today: na   Requested medication (s) are on the active medication list: yes   Last refill:  10/18/23 #240 0 refills  Future visit scheduled: no   Notes to clinic:  no refills remain. Do you want to refill Rx?     Requested Prescriptions  Pending Prescriptions Disp Refills   hydrOXYzine  (ATARAX ) 10 MG/5ML syrup [Pharmacy Med Name: HYDROXYZINE  10 MG/5 ML SYRUP] 240 mL 0    Sig: TAKE 12.5 ML BY MOUTH 3 TIMES A DAY AS NEEDED     Ear, Nose, and Throat:  Antihistamines 2 Passed - 04/21/2024 11:17 AM      Passed - Cr in normal range and within 360 days    Creatinine  Date Value Ref Range Status  12/06/2023 1.0 0.6 - 1.3 Final    Comment:    [facility]   Creat  Date Value Ref Range Status  04/03/2023 0.81 0.60 - 1.26 mg/dL Final         Passed - Valid encounter within last 12 months    Recent Outpatient Visits           4 months ago Chest pain, unspecified type   [facility] [place], Angeline ORN, NP

## 2024-05-07 ENCOUNTER — Other Ambulatory Visit: Payer: Self-pay

## 2024-05-07 ENCOUNTER — Encounter: Payer: Self-pay | Admitting: Internal Medicine

## 2024-05-07 MED ORDER — HYDROXYZINE HCL 10 MG/5ML PO SYRP: 25.0000 mg | ORAL_SOLUTION | Freq: Three times a day (TID) | ORAL | 0 refills | Status: AC | PRN

## 2024-05-12 ENCOUNTER — Ambulatory Visit: Payer: Self-pay | Admitting: Gastroenterology

## 2024-06-10 ENCOUNTER — Ambulatory Visit: Payer: Self-pay | Admitting: Gastroenterology
